# Patient Record
Sex: Female | Born: 1960 | Race: White | Hispanic: No | Marital: Married | Smoking: Never smoker
Health system: Southern US, Community
[De-identification: ages and names within clinical notes are randomized; demographics above are authoritative.]

## PROBLEM LIST (undated history)

## (undated) DIAGNOSIS — G43909 Migraine, unspecified, not intractable, without status migrainosus: Secondary | ICD-10-CM

## (undated) DIAGNOSIS — I10 Essential (primary) hypertension: Secondary | ICD-10-CM

## (undated) DIAGNOSIS — Z9889 Other specified postprocedural states: Secondary | ICD-10-CM

## (undated) DIAGNOSIS — M1990 Unspecified osteoarthritis, unspecified site: Secondary | ICD-10-CM

## (undated) DIAGNOSIS — K219 Gastro-esophageal reflux disease without esophagitis: Secondary | ICD-10-CM

## (undated) DIAGNOSIS — M797 Fibromyalgia: Secondary | ICD-10-CM

## (undated) DIAGNOSIS — E119 Type 2 diabetes mellitus without complications: Secondary | ICD-10-CM

## (undated) DIAGNOSIS — N189 Chronic kidney disease, unspecified: Secondary | ICD-10-CM

## (undated) DIAGNOSIS — G4730 Sleep apnea, unspecified: Secondary | ICD-10-CM

## (undated) DIAGNOSIS — F32A Depression, unspecified: Secondary | ICD-10-CM

## (undated) HISTORY — DX: Depression, unspecified: F32.A

## (undated) HISTORY — DX: Type 2 diabetes mellitus without complications: E11.9

## (undated) HISTORY — DX: Gastro-esophageal reflux disease without esophagitis: K21.9

## (undated) HISTORY — DX: Chronic kidney disease, unspecified: N18.9

## (undated) HISTORY — PX: SHOULDER SURGERY: SHX246

## (undated) HISTORY — PX: ABDOMINAL HYSTERECTOMY: SHX81

## (undated) HISTORY — PX: TONSILLECTOMY: SUR1361

## (undated) HISTORY — PX: PLACEMENT OF BREAST IMPLANTS: SHX6334

---

## 2014-02-04 DIAGNOSIS — C44301 Unspecified malignant neoplasm of skin of nose: Secondary | ICD-10-CM

## 2014-02-04 HISTORY — DX: Unspecified malignant neoplasm of skin of nose: C44.301

## 2015-12-10 ENCOUNTER — Encounter (HOSPITAL_COMMUNITY): Payer: Self-pay | Admitting: *Deleted

## 2015-12-10 ENCOUNTER — Emergency Department (HOSPITAL_COMMUNITY)
Admission: EM | Admit: 2015-12-10 | Discharge: 2015-12-10 | Disposition: A | Discharge status: Home / Self Care | Payer: BC Managed Care – PPO | Attending: Emergency Medicine | Admitting: Emergency Medicine

## 2015-12-10 DIAGNOSIS — G43009 Migraine without aura, not intractable, without status migrainosus: Secondary | ICD-10-CM | POA: Insufficient documentation

## 2015-12-10 DIAGNOSIS — Z7982 Long term (current) use of aspirin: Secondary | ICD-10-CM | POA: Insufficient documentation

## 2015-12-10 DIAGNOSIS — I10 Essential (primary) hypertension: Secondary | ICD-10-CM | POA: Insufficient documentation

## 2015-12-10 DIAGNOSIS — Z79899 Other long term (current) drug therapy: Secondary | ICD-10-CM | POA: Insufficient documentation

## 2015-12-10 DIAGNOSIS — R51 Headache: Secondary | ICD-10-CM | POA: Diagnosis present

## 2015-12-10 HISTORY — DX: Essential (primary) hypertension: I10

## 2015-12-10 HISTORY — DX: Migraine, unspecified, not intractable, without status migrainosus: G43.909

## 2015-12-10 MED ORDER — METOCLOPRAMIDE HCL 5 MG/ML IJ SOLN
10.0000 mg | Freq: Once | INTRAMUSCULAR | Status: AC
  Administered 2015-12-10: 10 mg via INTRAVENOUS
  Filled 2015-12-10: qty 2

## 2015-12-10 MED ORDER — SODIUM CHLORIDE 0.9 % IV BOLUS (SEPSIS)
1000.0000 mL | Freq: Once | INTRAVENOUS | Status: AC
  Administered 2015-12-10: 1000 mL via INTRAVENOUS

## 2015-12-10 MED ORDER — MAGNESIUM SULFATE 2 GM/50ML IV SOLN
2.0000 g | Freq: Once | INTRAVENOUS | Status: AC
  Administered 2015-12-10: 2 g via INTRAVENOUS
  Filled 2015-12-10: qty 50

## 2015-12-10 MED ORDER — DEXAMETHASONE SODIUM PHOSPHATE 10 MG/ML IJ SOLN
10.0000 mg | Freq: Once | INTRAMUSCULAR | Status: AC
  Administered 2015-12-10: 10 mg via INTRAVENOUS
  Filled 2015-12-10: qty 1

## 2015-12-10 MED ORDER — VALPROATE SODIUM 500 MG/5ML IV SOLN
1000.0000 mg | Freq: Once | INTRAVENOUS | Status: AC
  Administered 2015-12-10: 1000 mg via INTRAVENOUS
  Filled 2015-12-10: qty 10

## 2015-12-10 MED ORDER — VALPROATE SODIUM 500 MG/5ML IV SOLN
INTRAVENOUS | Status: AC
  Filled 2015-12-10: qty 10

## 2015-12-10 MED ORDER — DIPHENHYDRAMINE HCL 50 MG/ML IJ SOLN
25.0000 mg | Freq: Once | INTRAMUSCULAR | Status: AC
  Administered 2015-12-10: 25 mg via INTRAVENOUS
  Filled 2015-12-10: qty 1

## 2015-12-10 NOTE — ED Provider Notes (Signed)
[facility] Provider Note   CSN: [id number] Arrival date & time: 12/10/15  9562   Time seen 03:42 AM  History   Chief Complaint Chief Complaint  Patient presents with  . Headache    HPI Ashley Hickman is a 55 y.o. female.  HPI  she reports a long history of migraine headaches since she has been an adult. She reports however her headaches have been getting worse the last 2-1/2-3 months. She has been seen by her PCP who increased the dose of her Topamax which is what she takes prophylactically to prevent migraines. She reports it is not helping. She reports she went to an urgent care in the past 2 weeks and was given steroids which helped her headache. She states this headache happen yesterday morning when she awakened from sleep at about 7 AM. The pain is holocranio-and goes into her neck. She describes the headache as a pressure sensation. She has tried Excedrin Migraine, caffeine pills, ice on her neck, and sitting in a dark room without improvement of her headache. She has nausea without vomiting, she denies numbness of her extremities or tingling of her extremities, no visual changes, no aura. She denies fever. She states that bright lights and loud noises make the headache worse, however squeezing or putting pressure on her [place] makes the headache feel better. She reports she has an appointment on Tuesday, November 7 with a neurologist in [place] to evaluate her headaches. She states her doctor did a memory test on her and she does have some memory loss. She states she was evaluated by a neurologist about a year ago for memory loss. At that time she had a LP and had a post LP headache.  She reports family history of migraines in her maternal grandmother and a maternal uncle (who gets auras)  PCP [facility] at [facility]  Past Medical History:  Diagnosis Date  . Hypertension   . Migraine   depression, anxiety  There are no active problems to display for this  patient.   Past Surgical History:  Procedure Laterality Date  . CESAREAN SECTION    . PLACEMENT OF BREAST IMPLANTS    . SHOULDER SURGERY Right   . TONSILLECTOMY      OB History    No data available       Home Medications    Prior to Admission medications   Medication Sig Start Date End Date Taking? Authorizing Provider  aspirin-acetaminophen-caffeine (EXCEDRIN MIGRAINE) [phone] MG tablet Take by mouth every 6 (six) hours as needed for headache.   Yes Historical Provider, MD  eszopiclone (LUNESTA) 2 MG TABS tablet Take 2 mg by mouth at bedtime. Take immediately before bedtime   Yes Historical Provider, MD  omeprazole (PRILOSEC) 40 MG capsule Take 40 mg by mouth daily.   Yes Historical Provider, MD  topiramate (TOPAMAX) 100 MG tablet Take 100 mg by mouth 2 (two) times daily.   Yes Historical Provider, MD  Venlafaxine HCl 225 MG TB24 Take 225 mg by mouth.   Yes Historical Provider, MD    Family History History reviewed. No pertinent family history.  Social History Social History  Substance Use Topics  . Smoking status: Never Smoker  . Smokeless tobacco: Never Used  . Alcohol use Yes     Comment: occasionally  lives with spouse unemployed   Allergies   Penicillins and Sulfa antibiotics   Review of Systems Review of Systems  All other systems reviewed and are negative.  Physical Exam Updated Vital Signs BP 146/92 (BP Location: Left Arm)   Pulse 88   Temp 98.2 F (36.8 C) (Oral)   Resp 18   Ht 5' (1.524 m)   Wt 152 lb (68.9 kg)   SpO2 97%   BMI 29.69 kg/m   Vital signs normal    Physical Exam  Constitutional: She is oriented to person, place, and time. She appears well-developed and well-nourished.  Non-toxic appearance. She does not appear ill. No distress.  HENT:  Tax: Normocephalic and atraumatic.  Right Ear: External ear normal.  Left Ear: External ear normal.  Nose: Nose normal. No mucosal edema or rhinorrhea.  Mouth/Throat: Oropharynx  is clear and moist and mucous membranes are normal. No dental abscesses or uvula swelling.  Eyes: Conjunctivae and EOM are normal. Pupils are equal, round, and reactive to light.  Neck: Normal range of motion and full passive range of motion without pain. Neck supple.  Cardiovascular: Normal rate, regular rhythm and normal heart sounds.  Exam reveals no gallop and no friction rub.   No murmur heard. Pulmonary/Chest: Effort normal and breath sounds normal. No respiratory distress. She has no wheezes. She has no rhonchi. She has no rales. She exhibits no tenderness and no crepitus.  Abdominal: Soft. Normal appearance and bowel sounds are normal. She exhibits no distension. There is no tenderness. There is no rebound and no guarding.  Musculoskeletal: Normal range of motion. She exhibits no edema or tenderness.  Moves all extremities well.   Neurological: She is alert and oriented to person, place, and time. She has normal strength. No cranial nerve deficit.  Skin: Skin is warm, dry and intact. No rash noted. No erythema. No pallor.  Psychiatric: She has a normal mood and affect. Her speech is normal and behavior is normal. Her mood appears not anxious.  Nursing note and vitals reviewed.    ED Treatments / Results  Labs (all labs ordered are listed, but only abnormal results are displayed) Labs Reviewed - No data to display  EKG  EKG Interpretation None       Radiology No results found.  Procedures Procedures (including critical care time)  Medications Ordered in ED Medications  valproate (DEPACON) 1,000 mg in dextrose 5 % 50 mL IVPB (not administered)  sodium chloride 0.9 % bolus 1,000 mL (0 mLs Intravenous Stopped 12/10/15 0546)  metoCLOPramide (REGLAN) injection 10 mg (10 mg Intravenous Given 12/10/15 0414)  diphenhydrAMINE (BENADRYL) injection 25 mg (25 mg Intravenous Given 12/10/15 0414)  dexamethasone (DECADRON) injection 10 mg (10 mg Intravenous Given 12/10/15 0414)   magnesium sulfate IVPB 2 g 50 mL (0 g Intravenous Stopped 12/10/15 0714)     Initial Impression / Assessment and Plan / ED Course  I have reviewed the triage vital signs and the nursing notes.  Pertinent labs & imaging results that were available during my care of the patient were reviewed by me and considered in my medical decision making (see chart for details).  Clinical Course    Pt was given migraine cocktail consisting of IV fluids, IV Reglan and Benadryl and IV Decadron which she states helped before.  Recheck 06:00 AM pt states headache better, but still present. IV magnesium was added.   Recheck 06:50 AM still has headache, given depacon IV.   Final Clinical Impressions(s) / ED Diagnoses   Final diagnoses:  Migraine without aura and without status migrainosus, not intractable    Plan discharge  Devoria AlbeIva Darcella Shiffman, MD, Armando GangFACEP  Devoria AlbeIva Latrell Potempa, MD 12/10/15 [phone]

## 2015-12-10 NOTE — ED Triage Notes (Signed)
Pt c/o headache that started yesterday pt states she has an appointment with a neurologist in 3 days

## 2015-12-10 NOTE — Discharge Instructions (Signed)
Drink plenty of fluids. Keep your appointment with the neurologist. Look at the information on the recurrent migraine headache information sheet.  Recheck as needed.

## 2016-02-14 ENCOUNTER — Emergency Department (HOSPITAL_COMMUNITY)
Admission: EM | Admit: 2016-02-14 | Discharge: 2016-02-14 | Disposition: A | Discharge status: Home / Self Care | Payer: BC Managed Care – PPO | Attending: Emergency Medicine | Admitting: Emergency Medicine

## 2016-02-14 ENCOUNTER — Encounter (HOSPITAL_COMMUNITY): Payer: Self-pay | Admitting: *Deleted

## 2016-02-14 ENCOUNTER — Emergency Department (HOSPITAL_COMMUNITY): Payer: BC Managed Care – PPO

## 2016-02-14 DIAGNOSIS — Z79899 Other long term (current) drug therapy: Secondary | ICD-10-CM | POA: Insufficient documentation

## 2016-02-14 DIAGNOSIS — I10 Essential (primary) hypertension: Secondary | ICD-10-CM | POA: Insufficient documentation

## 2016-02-14 DIAGNOSIS — R51 Headache: Secondary | ICD-10-CM | POA: Diagnosis present

## 2016-02-14 DIAGNOSIS — G43909 Migraine, unspecified, not intractable, without status migrainosus: Secondary | ICD-10-CM | POA: Insufficient documentation

## 2016-02-14 MED ORDER — KETOROLAC TROMETHAMINE 30 MG/ML IJ SOLN
30.0000 mg | Freq: Once | INTRAMUSCULAR | Status: AC
  Administered 2016-02-14: 30 mg via INTRAVENOUS
  Filled 2016-02-14: qty 1

## 2016-02-14 MED ORDER — MORPHINE SULFATE (PF) 4 MG/ML IV SOLN
4.0000 mg | Freq: Once | INTRAVENOUS | Status: AC
  Administered 2016-02-14: 4 mg via INTRAVENOUS
  Filled 2016-02-14: qty 1

## 2016-02-14 MED ORDER — PROCHLORPERAZINE EDISYLATE 5 MG/ML IJ SOLN: 10.0000 mg | INTRAMUSCULAR | Status: DC

## 2016-02-14 MED ORDER — PROCHLORPERAZINE EDISYLATE 5 MG/ML IJ SOLN
10.0000 mg | Freq: Four times a day (QID) | INTRAMUSCULAR | Status: DC | PRN
  Administered 2016-02-14: 10 mg via INTRAVENOUS
  Filled 2016-02-14: qty 2

## 2016-02-14 MED ORDER — SODIUM CHLORIDE 0.9 % IV BOLUS (SEPSIS)
1000.0000 mL | Freq: Once | INTRAVENOUS | Status: AC
Start: 2016-02-14 — End: 2016-02-14
  Administered 2016-02-14: 1000 mL via INTRAVENOUS

## 2016-02-14 NOTE — ED Provider Notes (Addendum)
[facility] Provider Note   CSN: [id number] Arrival date & time: 02/14/16  [id number]     History   Chief Complaint Chief Complaint  Patient presents with  . Headache    HPI Ashley Hickman is a 56 y.o. female.  HPI Patient presents to the emergency department with complaints of ongoing right-sided headache since yesterday.  She denies nausea vomiting.  She reports no significant photophobia.  She does have a history of migraine headaches but reports this feels slightly different.  She denies fevers or chills.  No neck stiffness.  No vomiting.  Pain is moderate in severity.  Her home migraine medications have not been helping.  No visual changes.  No recent injury or trauma.  No other complaints   Past Medical History:  Diagnosis Date  . Hypertension   . Migraine     There are no active problems to display for this patient.   Past Surgical History:  Procedure Laterality Date  . CESAREAN SECTION    . PLACEMENT OF BREAST IMPLANTS    . SHOULDER SURGERY Right   . TONSILLECTOMY      OB History    No data available       Home Medications    Prior to Admission medications   Medication Sig Start Date End Date Taking? Authorizing Provider  ARIPiprazole (ABILIFY) 5 MG tablet Take 5 mg by mouth daily.   Yes Historical Provider, MD  divalproex (DEPAKOTE) 500 MG DR tablet Take 500 mg by mouth 2 (two) times daily.   Yes Historical Provider, MD  lisinopril (PRINIVIL,ZESTRIL) 40 MG tablet Take 40 mg by mouth daily.   Yes Historical Provider, MD  omeprazole (PRILOSEC) 40 MG capsule Take 40 mg by mouth daily.   Yes Historical Provider, MD  rizatriptan (MAXALT) 10 MG tablet Take 10 mg by mouth as needed for migraine. May repeat in 2 hours if needed   Yes Historical Provider, MD  aspirin-acetaminophen-caffeine (EXCEDRIN MIGRAINE) [phone] MG tablet Take by mouth every 6 (six) hours as needed for headache.    Historical Provider, MD  Venlafaxine HCl 225 MG TB24 Take 225 mg by mouth.     Historical Provider, MD    Family History History reviewed. No pertinent family history.  Social History Social History  Substance Use Topics  . Smoking status: Never Smoker  . Smokeless tobacco: Never Used  . Alcohol use Yes     Comment: occasionally     Allergies   Penicillins and Sulfa antibiotics   Review of Systems Review of Systems  All other systems reviewed and are negative.    Physical Exam Updated Vital Signs BP 157/84   Pulse 76   Temp 98.3 F (36.8 C) (Oral)   Resp 18   Ht 5' (1.524 m)   Wt 155 lb (70.3 kg)   SpO2 95%   BMI 30.27 kg/m   Physical Exam  Constitutional: She is oriented to person, place, and time. She appears well-developed and well-nourished. No distress.  HENT:  [place]: Normocephalic and atraumatic.  Eyes: EOM are normal. Pupils are equal, round, and reactive to light.  Neck: Normal range of motion.  Cardiovascular: Normal rate, regular rhythm and normal heart sounds.   Pulmonary/Chest: Effort normal and breath sounds normal.  Abdominal: Soft. She exhibits no distension. There is no tenderness.  Musculoskeletal: Normal range of motion.  Neurological: She is alert and oriented to person, place, and time.  5/5 strength in major muscle groups of  bilateral upper and lower  extremities. Speech normal. No facial asymetry.   Skin: Skin is warm and dry.  Psychiatric: She has a normal mood and affect. Judgment normal.  Nursing note and vitals reviewed.    ED Treatments / Results  Labs (all labs ordered are listed, but only abnormal results are displayed) Labs Reviewed - No data to display  EKG  EKG Interpretation None       Radiology [place] CT - personally reviewed  Ct [place] Wo Contrast  Result Date: 02/14/2016 CLINICAL DATA:  Atypical headache, primarily on right side EXAM: CT [place] WITHOUT CONTRAST TECHNIQUE: Contiguous axial images were obtained from the base of the skull through the vertex without intravenous contrast.  COMPARISON:  None. FINDINGS: Brain: There is slight diffuse atrophy for age. There is no intracranial mass, hemorrhage, extra-axial fluid collection, or midline shift. There is slight small vessel disease in the centra semiovale bilaterally. No acute appearing infarct is demonstrable. Vascular: There is no hyperdense vessel. There is no evident vascular calcification. Skull: The bony calvarium appears intact. Sinuses/Orbits: There is mucosal thickening in several ethmoid air cells on the left. Visualized paranasal sinuses elsewhere are clear. Visualized orbits appear symmetric bilaterally. Other: Mastoid air cells are clear. IMPRESSION: Mild atrophy for age with mild patchy periventricular small vessel disease. No intracranial mass, hemorrhage, or extra-axial fluid collection. No acute appearing infarct. Mild mucosal thickening noted in several ethmoid air cells. Electronically Signed   By: Bretta Bang III M.D.   On: 02/14/2016 07:36     Procedures Procedures (including critical care time)  Medications Ordered in ED Medications  sodium chloride 0.9 % bolus 1,000 mL (1,000 mLs Intravenous New Bag/Given 02/14/16 1610)  ketorolac (TORADOL) 30 MG/ML injection 30 mg (30 mg Intravenous Given 02/14/16 0621)  morphine 4 MG/ML injection 4 mg (4 mg Intravenous Given 02/14/16 9604)     Initial Impression / Assessment and Plan / ED Course  I have reviewed the triage vital signs and the nursing notes.  Pertinent labs & imaging results that were available during my care of the patient were reviewed by me and considered in my medical decision making (see chart for details).  Clinical Course     7:37 AM Patient feels much better at this time.  Discharge home in good condition.  Nonfocal neurologic exam.  [place] CT per my evaluation shows no acute intracranial abnormality.  She feels better.  Home with Zofran.  Primary care and outpatient neurology follow-up.  Final Clinical Impressions(s) / ED Diagnoses     Final diagnoses:  Migraine without status migrainosus, not intractable, unspecified migraine type    New Prescriptions New Prescriptions   No medications on file     Azalia Bilis, MD 02/14/16 [id number]    Azalia Bilis, MD 02/14/16 [phone]

## 2016-02-14 NOTE — ED Triage Notes (Signed)
Pt c/o right side Hagstrom pain that radiates down her neck that started yesterday; pt denies any n/v

## 2016-02-14 NOTE — ED Notes (Signed)
ED Provider at bedside. 

## 2018-03-11 IMAGING — CT CT HEAD W/O CM
3 series · 15 of 45 positions shown, 18 images · non-contrast
Comparison: None.

CLINICAL DATA: Atypical headache, primarily on right side

EXAM:
CT HEAD WITHOUT CONTRAST
TECHNIQUE: Contiguous axial images were obtained from the base of the skull
through the vertex without intravenous contrast.

[Series 2: head trauma wo · axial · 0.41mm/px · z∈[+35,+150]mm · 9 of 28 slices shown, 12 images]
[im 3/28  brain]
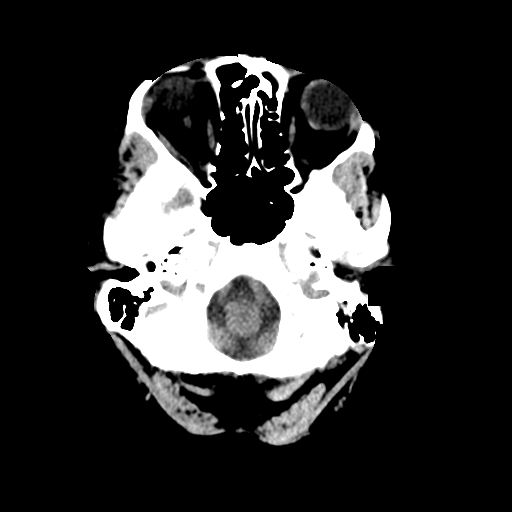
[im 3/28  bone]
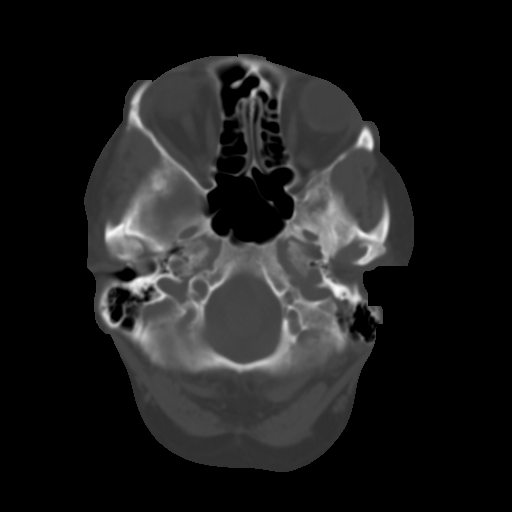
[im 6/28  brain]
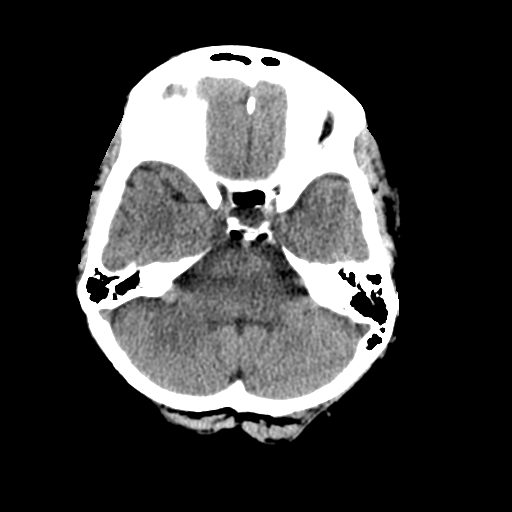
[im 9/28  brain]
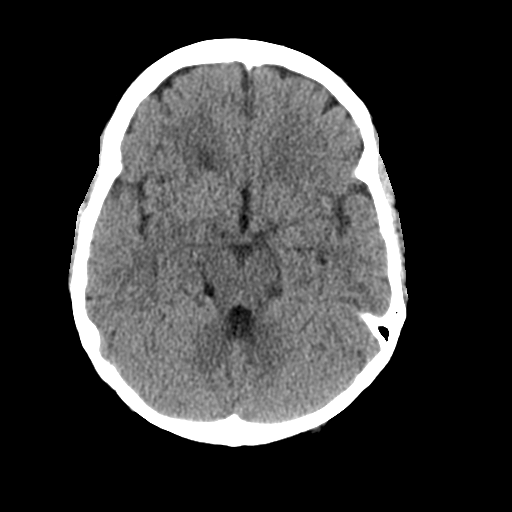
[im 12/28  brain]
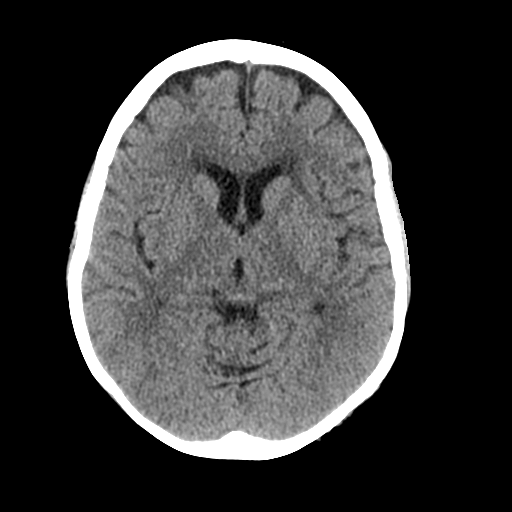
[im 15/28  brain]
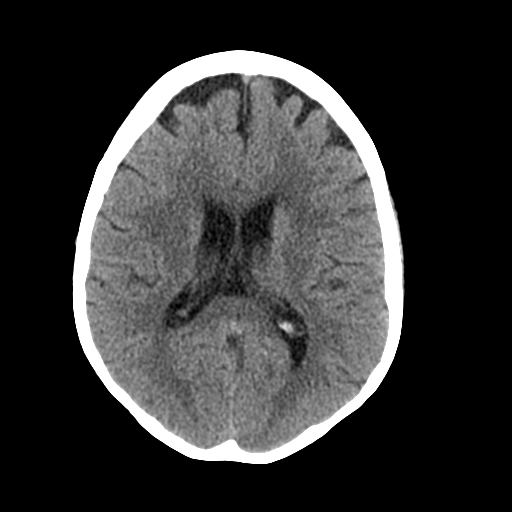
[im 15/28  bone]
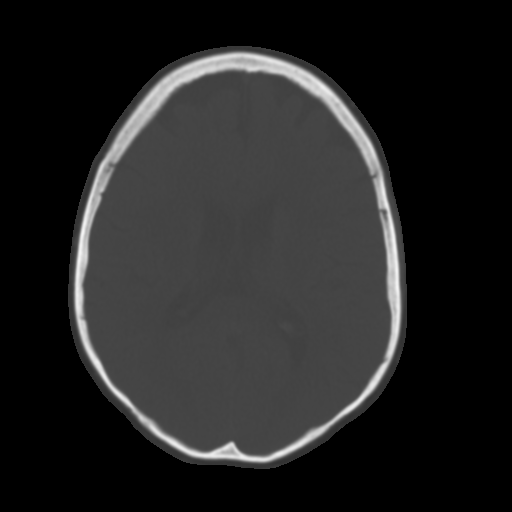
[im 17/28  brain]
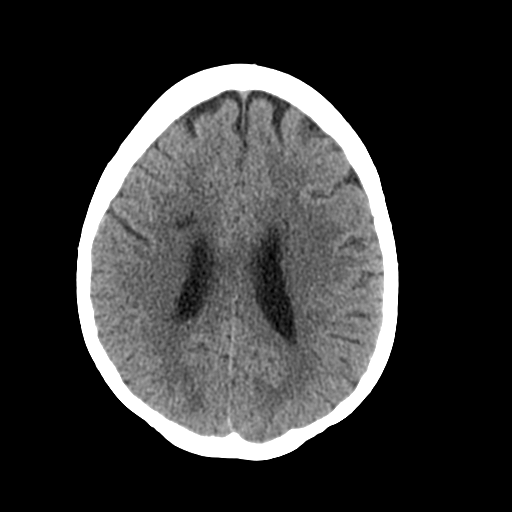
[im 20/28  brain]
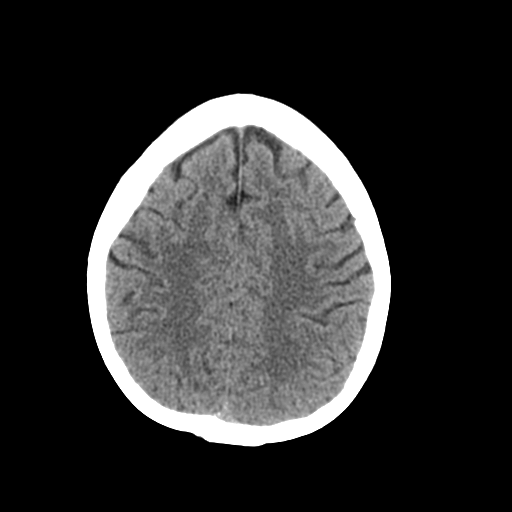
[im 23/28  brain]
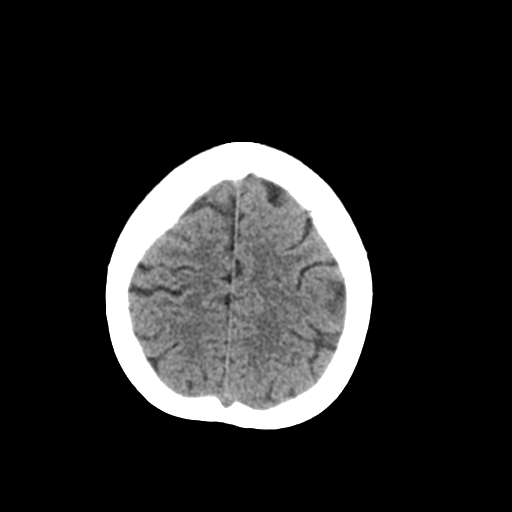
[im 26/28  brain]
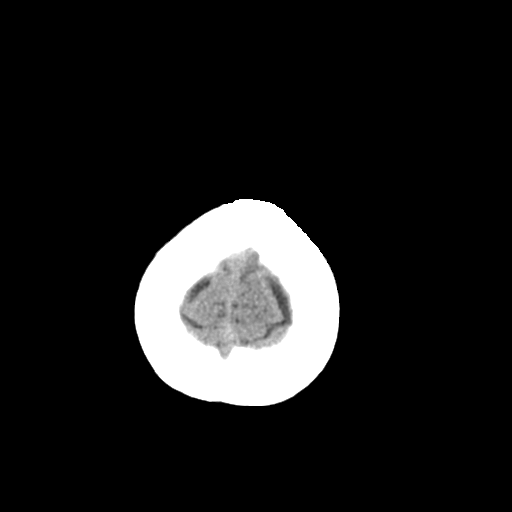
[im 26/28  bone]
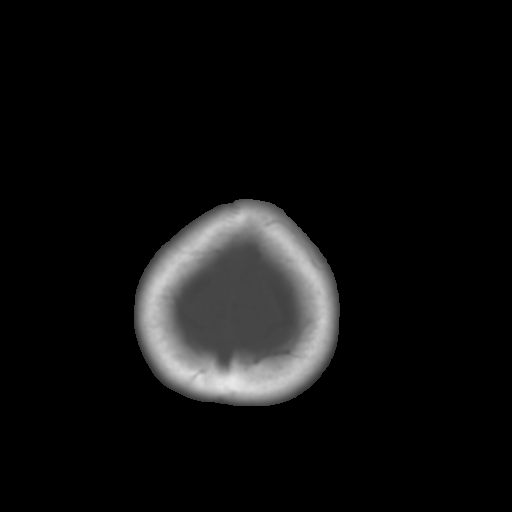

[Series 4: coronal soft tissue · coronal · 0.29mm/px · 3 of 61 slices shown]
[im 21/61  brain]
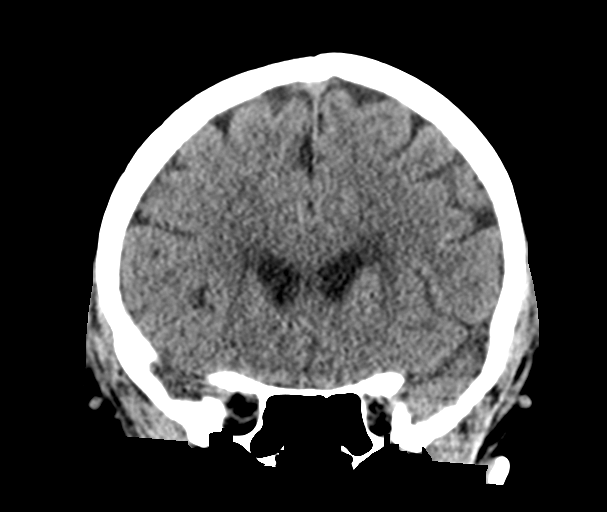
[im 27/61  brain]
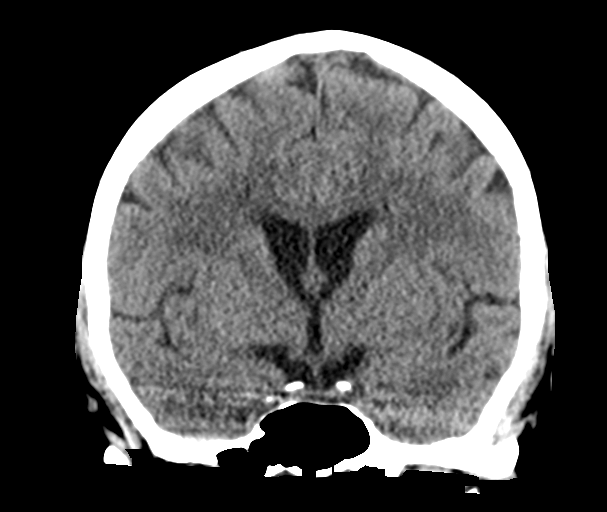
[im 34/61  brain]
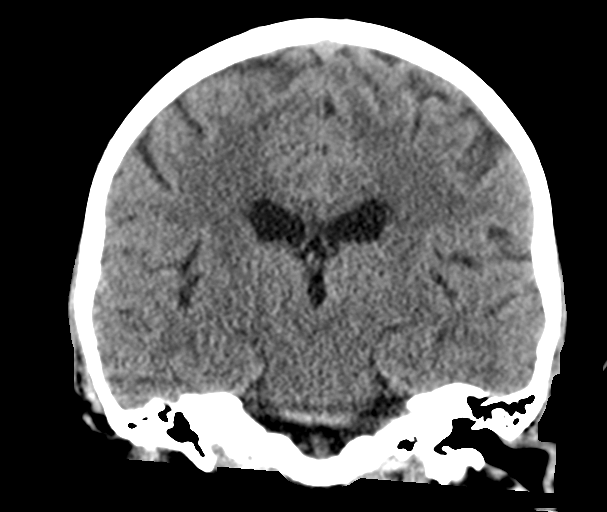

[Series 5: sagittal soft tissue · sagittal · 0.32mm/px · 3 of 50 slices shown]
[im 17/50  brain]
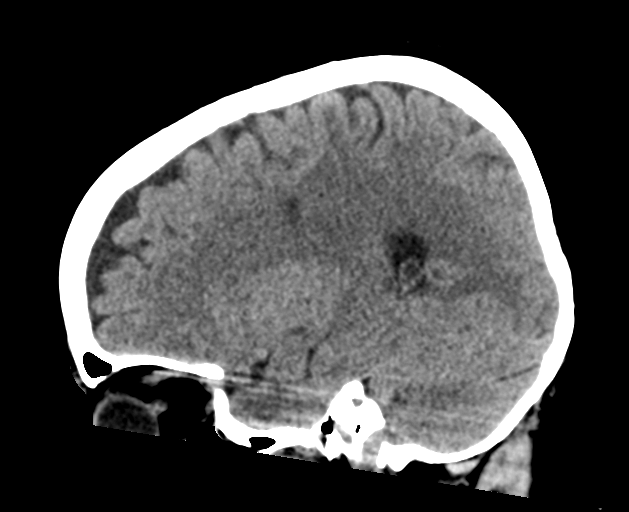
[im 25/50  brain]
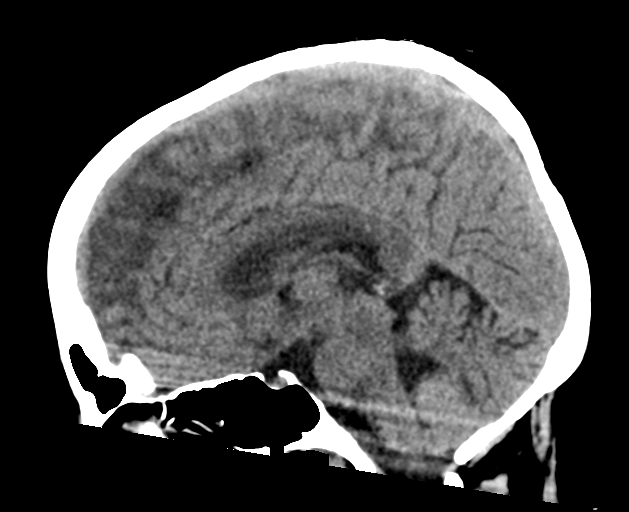
[im 33/50  brain]
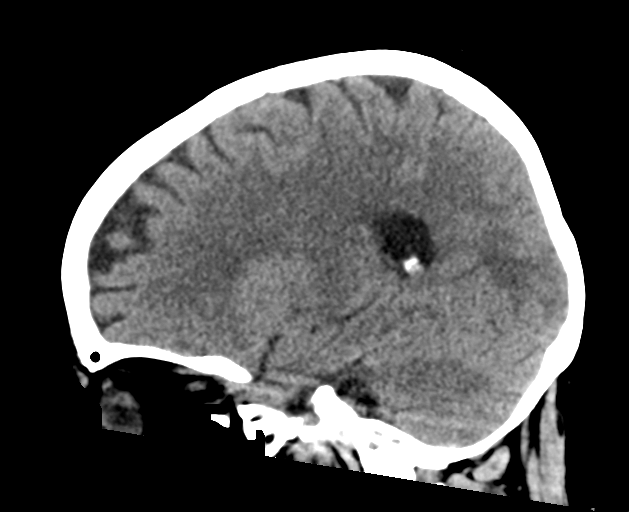

[15 of 45 positions shown; findings below may reference images not displayed]

FINDINGS: Brain: There is slight diffuse atrophy for age. There is no
intracranial mass, hemorrhage, extra-axial fluid collection, or
midline shift. There is slight small vessel disease in the centra
semiovale bilaterally. No acute appearing infarct is demonstrable.

Vascular: There is no hyperdense vessel. There is no evident
vascular calcification.

Skull: The bony calvarium appears intact.

Sinuses/Orbits: There is mucosal thickening in several ethmoid air
cells on the left. Visualized paranasal sinuses elsewhere are clear.
Visualized orbits appear symmetric bilaterally.

Other: Mastoid air cells are clear.
IMPRESSION: Mild atrophy for age with mild patchy periventricular small vessel
disease. No intracranial mass, hemorrhage, or extra-axial fluid
collection. No acute appearing infarct. Mild mucosal thickening
noted in several ethmoid air cells.

## 2019-02-05 DIAGNOSIS — I639 Cerebral infarction, unspecified: Secondary | ICD-10-CM

## 2019-02-05 HISTORY — DX: Cerebral infarction, unspecified: I63.9

## 2021-03-28 ENCOUNTER — Encounter: Payer: Self-pay | Admitting: *Deleted

## 2021-04-09 ENCOUNTER — Encounter: Payer: Self-pay | Admitting: Gastroenterology

## 2021-04-09 NOTE — Progress Notes (Signed)
Referring Provider: Janean Sark, NP Primary Care Physician:  Janean Sark, NP Primary Gastroenterologist:  Dr. Jena Gauss  Chief Complaint  Patient presents with   Colonoscopy    EGD, hemorrhoids bleeded    HPI:   Ashley Hickman is a 61 y.o. female presenting today at the request of Janean Sark, NP for consult colonoscopy/EGD.  No information included in referral to review.  Per patient, last colonoscopy 7- 10 years ago in [place]. Had some polyps. Sister with precancerous polyp and had to have 75% of her colon removed. Age 66.   Has brbpr intermittently for a couple of years. Associated rectal pain with a BM. Described as a dull pain. Blood is in toilet water and on toilet tissue. This past week, she had bleeding 4/7 days which is a little unusual. Doesn't use preparation H. Has chronic constipation. Bowels move every 3-4 days, incomplete, has to strain, hard. Will use use Miralax as needed which does work fairly well.  No unintentional weight loss.  She lost 50 pounds in the last year intentionally through dietary changes.  Feels solid foods get hung in her throat/upper esophagus. Liquids will push items down. Started 6-7 months ago. No trouble with soft foods, liquids, or pills. Chronic GERD well controlled on omeprazole 40 mg daily. Reports EGD in the past in [place] with normal exam. No prior dilation. No abdomial pain, nausea, or vomiting.   Takes Norco as need for chronic back pain. Not daily. At the most, a few days a week. May not take it for a whole week.   Past Medical History:  Diagnosis Date   CKD (chronic kidney disease)    Depression    GERD (gastroesophageal reflux disease)    Hypertension    Migraine    T2DM (type 2 diabetes mellitus) (HCC)     Past Surgical History:  Procedure Laterality Date   ABDOMINAL HYSTERECTOMY     CESAREAN SECTION     PLACEMENT OF BREAST IMPLANTS     SHOULDER SURGERY Right    TONSILLECTOMY      Current  Outpatient Medications  Medication Sig Dispense Refill   ARIPiprazole (ABILIFY) 5 MG tablet Take 5 mg by mouth daily.     Atogepant (QULIPTA) 60 MG TABS      B Complex-C (SUPER B-COMPLEX + VITAMIN C) TABS      Calcium Citrate-Vitamin D 315-5 MG-MCG TABS 1 tablet 1 (one) time each day     Cholecalciferol (VITAMIN D3) 25 MCG (1000 UT) CAPS      EASYMAX TEST test strip USE AS DIRECTED testing ONCE A DAY varying TIMES (fasting AND 2 HOURS AFTER largest MEAL)     Embrace Lancets Ultra Thin 30G MISC USE AS DIRECTED ONCE DAILY testing AT varying TIMES (fasting AND 2 HOURS AFTER largest MEAL)     eszopiclone (LUNESTA) 2 MG TABS tablet      Flaxseed, Linseed, (FLAXSEED OIL) 1200 MG CAPS      folic acid (FOLVITE) 800 MCG tablet      glucose blood (EASYMAX TEST) test strip USE AS DIRECTED testing ONCE A DAY varying TIMES (fasting AND 2 HOURS AFTER largest MEAL)     HYDROcodone-acetaminophen (NORCO) 10-325 MG tablet Take 1 tablet by mouth every 6 (six) hours as needed.     hydrocortisone (ANUSOL-HC) 2.5 % rectal cream Place 1 application. rectally 2 (two) times daily. 30 g 1   JANUVIA 50 MG tablet Take 50 mg by mouth daily.  lisinopril (ZESTRIL) 20 MG tablet Take 20 mg by mouth 2 (two) times daily.     Magnesium Citrate 100 MG CAPS Take by mouth.     memantine (NAMENDA) 10 MG tablet Take by mouth.     methocarbamol (ROBAXIN) 500 MG tablet Take 500 mg by mouth 3 (three) times daily as needed.     nitrofurantoin (MACRODANTIN) 100 MG capsule Take by mouth.     omeprazole (PRILOSEC) 40 MG capsule Take 40 mg by mouth daily.     propranolol (INDERAL) 10 MG tablet Take 20 mg by mouth 2 (two) times daily.     rizatriptan (MAXALT) 10 MG tablet Take 10 mg by mouth as needed for migraine. May repeat in 2 hours if needed     rosuvastatin (CRESTOR) 20 MG tablet Take 20 mg by mouth at bedtime.     tolterodine (DETROL LA) 4 MG 24 hr capsule Take 4 mg by mouth daily.     Turmeric 500 MG CAPS Take by mouth.      Venlafaxine HCl 225 MG TB24 Take 225 mg by mouth.     No current facility-administered medications for this visit.    Allergies as of 04/11/2021 - Review Complete 04/11/2021  Allergen Reaction Noted   Penicillins  12/10/2015   Sulfa antibiotics  12/10/2015    Family History  Problem Relation Age of Onset   Colon polyps Sister        pre-cancerous, numerous, required partial colectomy    Social History   Socioeconomic History   Marital status: Married    Spouse name: Not on file   Number of children: Not on file   Years of education: Not on file   Highest education level: Not on file  Occupational History   Not on file  Tobacco Use   Smoking status: Never   Smokeless tobacco: Never  Substance and Sexual Activity   Alcohol use: Yes    Comment: occasionally   Drug use: No   Sexual activity: Not on file  Other Topics Concern   Not on file  Social History Narrative   Not on file   Social Determinants of Health   Financial Resource Strain: Not on file  Food Insecurity: Not on file  Transportation Needs: Not on file  Physical Activity: Not on file  Stress: Not on file  Social Connections: Not on file  Intimate Partner Violence: Not on file    Review of Systems: Gen: Denies any fever, chills, cold or flulike symptoms, presyncope, syncope. CV: Denies chest pain, heart palpitations.  Resp: Denies shortness of breath or cough. GI: See HPI GU : Denies urinary burning, urinary frequency, urinary hesitancy MS: Denies joint pain. Derm: Denies rash. Psych: Admits to depression, under good control with medications. Heme: See HPI  Physical Exam: BP 120/72    Pulse 80    Temp 97.7 F (36.5 C) (Temporal)    Ht 5' (1.524 m)    Wt 142 lb (64.4 kg)    BMI 27.73 kg/m  General:   Alert and oriented. Pleasant and cooperative. Well-nourished and well-developed.  Clay:  Normocephalic and atraumatic. Eyes:  Without icterus, sclera clear and conjunctiva pink.  Ears:  Normal  auditory acuity. Lungs:  Clear to auscultation bilaterally. No wheezes, rales, or rhonchi. No distress.  Heart:  S1, S2 present without murmurs appreciated.  Abdomen:  +BS, soft, non-tender and non-distended. No HSM noted. No guarding or rebound. No masses appreciated.  Rectal:  External hemorrhoid appreciated without tenderness  to palpation, no appreciable internal hemorrhoids, soft, formed stool felt in rectal vault. No bright red blood or melena.  Msk:  Symmetrical without gross deformities. Normal posture. Extremities:  Without edema. Neurologic:  Alert and  oriented x4;  grossly normal neurologically. Skin:  Intact without significant lesions or rashes. Psych:  Normal mood and affect.    Assessment:  61 year old female with history of CKD, GERD, type 2 diabetes, HTN, presenting today to discuss scheduling surveillance colonoscopy and also reporting rectal bleeding, constipation, and dysphagia.  Rectal bleeding:  Couple year history of intermittent bright red blood per rectum that has been fairly infrequent with blood in toilet water and on toilet tissue.  This past week, she has had bleeding 4 out of 7 days a week which is a little unusual.  She has some associated rectal discomfort with bowel movements in the setting of constipation.  Rectal exam with external hemorrhoid appreciated, no internal hemorrhoids appreciated, formed stool in rectal vault.  No BRBPR or melena.  Last colonoscopy 7-10 years ago in [place].  Patient with polyps.  Suspect rectal bleeding is likely secondary to hemorrhoids though cannot rule out colon polyps or malignancy.  We will update a CBC, treat hemorrhoids with Anusol rectal cream, manage her constipation as per below, and plan to schedule a colonoscopy in the near future for further evaluation.  Constipation: Chronic history of constipation with bowel movements every 3 to 4 days that are incomplete, hard, and require straining.  Uses MiraLAX as needed which  does work well.  Recommended starting MiraLAX daily.  History of colon polyps: Patient reports history of colon polyps on her colonoscopy 7-10 years ago in [place].  She also reports her sister had numerous precancerous polyps and had to have 75% of her colon removed at age 61.  She is currently having some rectal bleeding as discussed above, possibly secondary to hemorrhoids in the setting of constipation though I am unable to rule out colon polyps or malignancy.  We will arrange for her to have a colonoscopy in the near future.  Constipation hemorrhoids addressed above.  We did discuss the importance of her bowels moving well prior to her colonoscopy.  She is to let me know if MiraLAX daily does not work well we will get her started on a prescriptive agent.  Dysphagia: 6-79-month history of intermittent solid food dysphagia to meats and breads.  Times passed with liquids.  No regurgitation.  She does have chronic history of GERD, but this is well controlled on omeprazole 40 mg daily.  Reports she had an EGD years ago in [place], but has never had esophageal dilation.  We will plan for EGD +/- dilation for further evaluation and therapeutic intervention.  Suspect she may have esophageal web, ring, or stricture.  Less likely malignancy.   Plan: CBC We will proceed with colonoscopy and upper endoscopy with possible esophageal dilation with propofol with Dr. Jena Gauss in the near future. The risks, benefits, and alternatives have been discussed with the patient in detail. The patient states understanding and desires to proceed.  ASA 2 See separate instructions for diabetes medication adjustments Anusol rectal cream twice daily x7 days, then as needed. MiraLAX 1 capful (17 g) daily in 8 ounces of water.  Requested to wait progress report.  Discussed the importance of bowels moving well prior to colonoscopy to ensure a good prep. Continue omeprazole 40 mg daily. Avoid tough textures, chopped meats finely,  eat slowly, take small bites, chew thoroughly, drink plenty  of liquids throughout meals. Advised to proceed to the emergency room if something gets hung in her esophagus and will not come up or go down. Follow-up after procedures.   Ermalinda MemosKristen Corrigan Kretschmer, PA-C [facility] 04/11/2021

## 2021-04-11 ENCOUNTER — Other Ambulatory Visit: Payer: Self-pay

## 2021-04-11 ENCOUNTER — Encounter: Payer: Self-pay | Admitting: Gastroenterology

## 2021-04-11 ENCOUNTER — Ambulatory Visit (INDEPENDENT_AMBULATORY_CARE_PROVIDER_SITE_OTHER): Payer: BC Managed Care – PPO | Admitting: Gastroenterology

## 2021-04-11 VITALS — BP 120/72 | HR 80 | Temp 97.7°F | Ht 60.0 in | Wt 142.0 lb

## 2021-04-11 DIAGNOSIS — K625 Hemorrhage of anus and rectum: Secondary | ICD-10-CM | POA: Diagnosis not present

## 2021-04-11 DIAGNOSIS — Z86010 Personal history of colonic polyps: Secondary | ICD-10-CM

## 2021-04-11 DIAGNOSIS — R1310 Dysphagia, unspecified: Secondary | ICD-10-CM | POA: Diagnosis not present

## 2021-04-11 DIAGNOSIS — K649 Unspecified hemorrhoids: Secondary | ICD-10-CM | POA: Diagnosis not present

## 2021-04-11 DIAGNOSIS — K5900 Constipation, unspecified: Secondary | ICD-10-CM

## 2021-04-11 MED ORDER — HYDROCORTISONE (PERIANAL) 2.5 % EX CREA: 1.0000 "application " | TOPICAL_CREAM | Freq: Two times a day (BID) | CUTANEOUS | 1 refills | Status: DC

## 2021-04-11 NOTE — Patient Instructions (Addendum)
Please have blood work completed at [facility]. ? ?We will arrange for you to have an upper endoscopy with possible stretching of your esophagus and colonoscopy in the near future with Dr. Jena Gauss. ?1 day prior to procedure: You may take Januvia as prescribed. ?Day of your procedure: Do not take any morning diabetes medications. ? ?For hemorrhoids/rectal bleeding: ?Use Anusol rectal cream twice daily for 7 days, then stop.  Use again as needed. ? ?For constipation: ?Start MiraLAX 1 capful (17 g) daily in 8 ounces of water.  ?Please call with a progress report in 2 weeks to let me know how this is working for you. ?As we discussed, it is very important that we get your bowels moving regularly prior to your colonoscopy to ensure that you have a good prep. ? ?For your reflux symptoms: ?Continue taking omeprazole 40 mg daily 30 minutes before breakfast. ? ?For your swallowing trouble: ?We will be evaluating this at the time of your upper endoscopy. ?Eat slowly, take small bites, chew thoroughly, drink plenty of liquids throughout your meals. ?Avoid tough textures. ?Meats should be chopped finely. ?If something gets stuck in your esophagus and will not come up or go down, proceed to the emergency room. ? ?We will see you back after your procedures.  Do not hesitate to call if you have any questions or concerns prior to your next visit. ? ?It was a pleasure meeting you today! ? ?Ermalinda Memos, PA-C ?[facility] ? ?

## 2021-04-12 ENCOUNTER — Telehealth: Payer: Self-pay | Admitting: *Deleted

## 2021-04-12 MED ORDER — CLENPIQ 10-3.5-12 MG-GM -GM/160ML PO SOLN: 1.0000 | Freq: Once | ORAL | 0 refills | Status: AC

## 2021-04-12 NOTE — Telephone Encounter (Signed)
Called pt. She has been scheduled for TCS/EGD +/-DIL with propofol Dr. Gala Romney, ASA 2 on 4/24 at 7:30am. Aware will send rx to pharmacy and mail instructions. ?

## 2021-04-20 ENCOUNTER — Telehealth: Payer: Self-pay | Admitting: Internal Medicine

## 2021-04-20 ENCOUNTER — Other Ambulatory Visit: Payer: Self-pay | Admitting: Gastroenterology

## 2021-04-20 NOTE — Telephone Encounter (Signed)
Pt LMOM that she received her paperwork for her colonoscopy with Dr Jena Gauss on 05/28/2021 and wanted to let us know that she was diabetic. Please advise. [phone] ?

## 2021-04-21 LAB — CBC WITH DIFFERENTIAL/PLATELET
Basophils Absolute: 0.1 10*3/uL (ref 0.0–0.2)
Basos: 1 %
EOS (ABSOLUTE): 0.2 10*3/uL (ref 0.0–0.4)
Eos: 2 %
Hematocrit: 43.8 % (ref 34.0–46.6)
Hemoglobin: 14.1 g/dL (ref 11.1–15.9)
Immature Grans (Abs): 0 10*3/uL (ref 0.0–0.1)
Immature Granulocytes: 0 %
Lymphocytes Absolute: 2.1 10*3/uL (ref 0.7–3.1)
Lymphs: 22 %
MCH: 27.9 pg (ref 26.6–33.0)
MCHC: 32.2 g/dL (ref 31.5–35.7)
MCV: 87 fL (ref 79–97)
Monocytes Absolute: 0.7 10*3/uL (ref 0.1–0.9)
Monocytes: 8 %
Neutrophils Absolute: 6.4 10*3/uL (ref 1.4–7.0)
Neutrophils: 67 %
Platelets: 276 10*3/uL (ref 150–450)
RBC: 5.05 x10E6/uL (ref 3.77–5.28)
RDW: 13.1 % (ref 11.7–15.4)
WBC: 9.5 10*3/uL (ref 3.4–10.8)

## 2021-04-23 NOTE — Telephone Encounter (Signed)
Noted. Instructions already have adjustments ?

## 2021-05-28 ENCOUNTER — Other Ambulatory Visit: Payer: Self-pay

## 2021-05-28 ENCOUNTER — Ambulatory Visit (HOSPITAL_COMMUNITY)
Admission: RE | Admit: 2021-05-28 | Discharge: 2021-05-28 | Disposition: A | Discharge status: Home / Self Care | Payer: BC Managed Care – PPO | Attending: Internal Medicine | Admitting: Internal Medicine

## 2021-05-28 ENCOUNTER — Encounter (HOSPITAL_COMMUNITY): Payer: Self-pay | Admitting: Internal Medicine

## 2021-05-28 ENCOUNTER — Encounter (HOSPITAL_COMMUNITY)
Admission: RE | Disposition: A | Discharge status: Home / Self Care | Payer: Self-pay | Source: Home / Self Care | Attending: Internal Medicine

## 2021-05-28 ENCOUNTER — Ambulatory Visit (HOSPITAL_COMMUNITY): Payer: BC Managed Care – PPO | Admitting: Anesthesiology

## 2021-05-28 ENCOUNTER — Telehealth: Payer: Self-pay | Admitting: *Deleted

## 2021-05-28 DIAGNOSIS — I129 Hypertensive chronic kidney disease with stage 1 through stage 4 chronic kidney disease, or unspecified chronic kidney disease: Secondary | ICD-10-CM | POA: Insufficient documentation

## 2021-05-28 DIAGNOSIS — Z7984 Long term (current) use of oral hypoglycemic drugs: Secondary | ICD-10-CM | POA: Diagnosis not present

## 2021-05-28 DIAGNOSIS — F32A Depression, unspecified: Secondary | ICD-10-CM | POA: Diagnosis not present

## 2021-05-28 DIAGNOSIS — N189 Chronic kidney disease, unspecified: Secondary | ICD-10-CM | POA: Insufficient documentation

## 2021-05-28 DIAGNOSIS — E1122 Type 2 diabetes mellitus with diabetic chronic kidney disease: Secondary | ICD-10-CM | POA: Insufficient documentation

## 2021-05-28 DIAGNOSIS — R1310 Dysphagia, unspecified: Secondary | ICD-10-CM | POA: Insufficient documentation

## 2021-05-28 DIAGNOSIS — Z8371 Family history of colonic polyps: Secondary | ICD-10-CM | POA: Insufficient documentation

## 2021-05-28 DIAGNOSIS — K317 Polyp of stomach and duodenum: Secondary | ICD-10-CM | POA: Insufficient documentation

## 2021-05-28 DIAGNOSIS — K5900 Constipation, unspecified: Secondary | ICD-10-CM | POA: Insufficient documentation

## 2021-05-28 DIAGNOSIS — K921 Melena: Secondary | ICD-10-CM

## 2021-05-28 DIAGNOSIS — K219 Gastro-esophageal reflux disease without esophagitis: Secondary | ICD-10-CM | POA: Insufficient documentation

## 2021-05-28 DIAGNOSIS — R519 Headache, unspecified: Secondary | ICD-10-CM | POA: Diagnosis not present

## 2021-05-28 HISTORY — PX: BIOPSY: SHX5522

## 2021-05-28 HISTORY — PX: COLONOSCOPY WITH PROPOFOL: SHX5780

## 2021-05-28 HISTORY — PX: ESOPHAGOGASTRODUODENOSCOPY (EGD) WITH PROPOFOL: SHX5813

## 2021-05-28 HISTORY — PX: MALONEY DILATION: SHX5535

## 2021-05-28 LAB — GLUCOSE, CAPILLARY: Glucose-Capillary: 82 mg/dL (ref 70–99)

## 2021-05-28 SURGERY — COLONOSCOPY WITH PROPOFOL
Anesthesia: General

## 2021-05-28 MED ORDER — PROPOFOL 1000 MG/100ML IV EMUL
INTRAVENOUS | Status: AC
  Filled 2021-05-28: qty 100

## 2021-05-28 MED ORDER — PROPOFOL 10 MG/ML IV BOLUS
INTRAVENOUS | Status: DC | PRN
  Administered 2021-05-28: 100 mg via INTRAVENOUS

## 2021-05-28 MED ORDER — LACTATED RINGERS IV SOLN: INTRAVENOUS | Status: DC

## 2021-05-28 MED ORDER — PROPOFOL 500 MG/50ML IV EMUL
INTRAVENOUS | Status: DC | PRN
  Administered 2021-05-28: 150 ug/kg/min via INTRAVENOUS

## 2021-05-28 NOTE — Op Note (Signed)
[facility] ?Patient Name: Ashley Hickman ?Procedure Date: 05/28/2021 7:09 AM ?MRN: [id number] ?Date of Birth: 1961-01-27 ?Attending MD: Norvel Richards , MD ?CSN: [id number] ?Age: 61 ?Admit Type: Outpatient ?Procedure:                Upper GI endoscopy ?Indications:              Dysphagia ?Providers:                Norvel Richards, MD, Janeece Riggers, RN, Eugene Garnet  ?                          Shanon Brow, Technician ?Referring MD:              ?Medicines:                Propofol per Anesthesia ?Complications:            No immediate complications. ?Estimated Blood Loss:     Estimated blood loss was minimal. ?Procedure:                Pre-Anesthesia Assessment: ?                          - Prior to the procedure, a History and Physical  ?                          was performed, and patient medications and  ?                          allergies were reviewed. The patient's tolerance of  ?                          previous anesthesia was also reviewed. The risks  ?                          and benefits of the procedure and the sedation  ?                          options and risks were discussed with the patient.  ?                          All questions were answered, and informed consent  ?                          was obtained. Prior Anticoagulants: The patient has  ?                          taken no previous anticoagulant or antiplatelet  ?                          agents. ASA Grade Assessment: II - A patient with  ?                          mild systemic disease. After reviewing the risks  ?  and benefits, the patient was deemed in  ?                          satisfactory condition to undergo the procedure. ?                          After obtaining informed consent, the endoscope was  ?                          passed under direct vision. Throughout the  ?                          procedure, the patient's blood pressure, pulse, and  ?                          oxygen saturations were  monitored continuously. The  ?                          [id number] scope was introduced through the  ?                          mouth, and advanced to the second part of duodenum.  ?                          The upper GI endoscopy was accomplished without  ?                          difficulty. The patient tolerated the procedure  ?                          well. ?Scope In: 7:38:38 AM ?Scope Out: 7:50:30 AM ?Total Procedure Duration: 0 hours 11 minutes 52 seconds  ?Findings: ?     The examined esophagus was normal. Normal-appearing stomach except for  ?     scattered 1 to 3 mm fundal gland appearing polyps studding the gastric  ?     body and fundus. No ulcer or infiltrating process. Patent pylorus. ?     The duodenal bulb and second portion of the duodenum were normal. The  ?     scope was withdrawn. Dilation was performed with a Maloney dilator with  ?     mild resistance at 73 Fr. The dilation site was examined following  ?     endoscope reinsertion and showed no change. Estimated blood loss: none.  ?     Finally, the mid and distal esophagus was biopsied for histologic study. ?Impression:               - Normal esophagus. Dilated and biopsied. Gastric  ?                          polyps status post biopsy ?                          - Normal duodenal bulb and second portion of the  ?  duodenum. Follow-up on pathology. Further  ?                          recommendations. See colonoscopy report ?Moderate Sedation: ?     Moderate (conscious) sedation was personally administered by an  ?     anesthesia professional. The following parameters were monitored: oxygen  ?     saturation, heart rate, blood pressure, respiratory rate, EKG, adequacy  ?     of pulmonary ventilation, and response to care. ?Recommendation:           - Patient has a contact number available for  ?                          emergencies. The signs and symptoms of potential  ?                          delayed  complications were discussed with the  ?                          patient. Return to normal activities tomorrow.  ?                          Written discharge instructions were provided to the  ?                          patient. ?                          - Advance diet as tolerated. ?                          - Continue present medications. ?                          - Return to my office in 1 month. ?Procedure Code(s):        --- Professional --- ?                          [phone], Esophagogastroduodenoscopy, flexible,  ?                          transoral; diagnostic, including collection of  ?                          specimen(s) by brushing or washing, when performed  ?                          (separate procedure) ?                          [id number], Dilation of esophagus, by unguided sound or  ?                          bougie, single or multiple passes ?Diagnosis Code(s):        --- Professional --- ?  R13.10, Dysphagia, unspecified ?CPT copyright 2019 American Medical Association. All rights reserved. ?The codes documented in this report are preliminary and upon coder review may  ?be revised to meet current compliance requirements. ?Cristopher Estimable. Maitlyn Penza, MD ?Norvel Richards, MD ?05/28/2021 8:09:10 AM ?This report has been signed electronically. ?Number of Addenda: 0 ?

## 2021-05-28 NOTE — Anesthesia Preprocedure Evaluation (Addendum)
Anesthesia Evaluation  ?Patient identified by MRN, date of birth, ID band ?Patient awake ? ? ? ?Reviewed: ?Allergy & Precautions, NPO status , Patient's Chart, lab work & pertinent test results, reviewed documented beta blocker date and time  ? ?Airway ?Mallampati: III ? ?TM Distance: >3 FB ?Neck ROM: Full ? ?Mouth opening: Limited Mouth Opening ? Dental ? ?(+) Dental Advisory Given, Missing ?  ?Pulmonary ?sleep apnea (resolved after losing weight) ,  ?  ?Pulmonary exam normal ?breath sounds clear to auscultation ? ? ? ? ? ? Cardiovascular ?Exercise Tolerance: Poor ?METS (severe hip and back pain): hypertension, Pt. on medications and Pt. on home beta blockers ?Normal cardiovascular exam ?Rhythm:Regular Rate:Normal ? ? ?  ?Neuro/Psych ? Headaches, PSYCHIATRIC DISORDERS Depression   ? GI/Hepatic ?GERD  Medicated and Controlled,  ?Endo/Other  ?diabetes, Well Controlled, Type 2, Oral Hypoglycemic Agents ? Renal/GU ?Renal InsufficiencyRenal disease  ? ?  ?Musculoskeletal ?negative musculoskeletal ROS ?(+)  ? Abdominal ?  ?Peds ? Hematology ?negative hematology ROS ?(+)   ?Anesthesia Other Findings ? ? Reproductive/Obstetrics ?negative OB ROS ? ?  ? ? ? ? ? ? ? ? ? ? ? ? ? ?  ?  ? ? ? ? ? ? ? ?Anesthesia Physical ?Anesthesia Plan ? ?ASA: 2 ? ?Anesthesia Plan: General  ? ?Post-op Pain Management: Minimal or no pain anticipated  ? ?Induction: Intravenous ? ?PONV Risk Score and Plan: Propofol infusion and TIVA ? ?Airway Management Planned: Nasal Cannula and Natural Airway ? ?Additional Equipment:  ? ?Intra-op Plan:  ? ?Post-operative Plan:  ? ?Informed Consent: I have reviewed the patients History and Physical, chart, labs and discussed the procedure including the risks, benefits and alternatives for the proposed anesthesia with the patient or authorized representative who has indicated his/her understanding and acceptance.  ? ? ? ?Dental advisory given ? ?Plan Discussed with: CRNA and  Surgeon ? ?Anesthesia Plan Comments:   ? ? ? ? ? ? ?Anesthesia Quick Evaluation ? ?

## 2021-05-28 NOTE — Op Note (Signed)
[facility] ?Patient Name: Ashley Hickman ?Procedure Date: 05/28/2021 7:08 AM ?MRN: [id number] ?Date of Birth: 1961-01-18 ?Attending MD: Norvel Richards , MD ?CSN: [id number] ?Age: 61 ?Admit Type: Outpatient ?Procedure:                Colonoscopy ?Indications:              Hematochezia ?Providers:                Norvel Richards, MD, Janeece Riggers, RN, Eugene Garnet  ?                          Shanon Brow, Technician ?Referring MD:              ?Medicines:                Propofol per Anesthesia ?Complications:            No immediate complications. ?Estimated Blood Loss:     Estimated blood loss: none. ?Procedure:                Pre-Anesthesia Assessment: ?                          - Prior to the procedure, a History and Physical  ?                          was performed, and patient medications and  ?                          allergies were reviewed. The patient's tolerance of  ?                          previous anesthesia was also reviewed. The risks  ?                          and benefits of the procedure and the sedation  ?                          options and risks were discussed with the patient.  ?                          All questions were answered, and informed consent  ?                          was obtained. Prior Anticoagulants: The patient has  ?                          taken no previous anticoagulant or antiplatelet  ?                          agents. ASA Grade Assessment: II - A patient with  ?                          mild systemic disease. After reviewing the risks  ?                          and  benefits, the patient was deemed in  ?                          satisfactory condition to undergo the procedure. ?                          After obtaining informed consent, the colonoscope  ?                          was passed under direct vision. Throughout the  ?                          procedure, the patient's blood pressure, pulse, and  ?                          oxygen saturations were monitored  continuously. The  ?                          [id number]) scope was introduced through the  ?                          anus and advanced to the the distal transverse  ?                          colon. The colonoscopy was performed without  ?                          difficulty. An adequate preparation. ?Scope In: 7:55:51 AM ?Scope Out: 8:03:14 AM ?Total Procedure Duration: 0 hours 7 minutes 23 seconds  ?Findings: ?     The perianal and digital rectal examinations were normal. ?     Quite a bit of effluent present with vaginal matter viscous stool made  ?     exam more challenging. I lavaged and washed the left colon. Thought I  ?     was going to over come it. By the time I reached the transverse colon it  ?     was apparent that prep was inadequate. Procedure aborted. ?Impression:               - Inadequate preparation. Colonic mucosa  ?                          incompletely seen. ?Moderate Sedation: ?     Moderate (conscious) sedation was personally administered by an  ?     anesthesia professional. The following parameters were monitored: oxygen  ?     saturation, heart rate, blood pressure, and response to care. ?Recommendation:           - Patient has a contact number available for  ?                          emergencies. The signs and symptoms of potential  ?                          delayed complications were discussed with the  ?  patient. Return to normal activities tomorrow.  ?                          Written discharge instructions were provided to the  ?                          patient. ?                          - Advance diet as tolerated. Return to the office  ?                          in 1 month to get rescheduled/reprepped. See EGD  ?                          report. ?Procedure Code(s):        --- Professional --- ?                          [phone], Colonoscopy, flexible; diagnostic, including  ?                          collection of specimen(s) by brushing or  washing,  ?                          when performed (separate procedure) ?Diagnosis Code(s):        --- Professional --- ?                          K92.1, Melena (includes Hematochezia) ?CPT copyright 2019 American Medical Association. All rights reserved. ?The codes documented in this report are preliminary and upon coder review may  ?be revised to meet current compliance requirements. ?Cristopher Estimable. Darrel Gloss, MD ?Norvel Richards, MD ?05/28/2021 8:12:49 AM ?This report has been signed electronically. ?Number of Addenda: 0 ?

## 2021-05-28 NOTE — H&P (Signed)
?@LOGO @ ? ? ?Primary Care Physician:  , NP ?Primary Gastroenterologist:  Dr. Janean Sark ? ?Pre-Procedure History & Physical: ?HPI:  Ashley Hickman is a 61 y.o. female here for further evaluation of esophageal dysphagia, rectal bleeding s.  She is here for EGD possible EGD and colonoscopy. ?Family history of "precancerous polyps". ? ?Past Medical History:  ?Diagnosis Date  ? CKD (chronic kidney disease)   ? Depression   ? GERD (gastroesophageal reflux disease)   ? Hypertension   ? Migraine   ? T2DM (type 2 diabetes mellitus) (HCC)   ? ? ?Past Surgical History:  ?Procedure Laterality Date  ? ABDOMINAL HYSTERECTOMY    ? CESAREAN SECTION    ? PLACEMENT OF BREAST IMPLANTS    ? SHOULDER SURGERY Right   ? TONSILLECTOMY    ? ? ?Prior to Admission medications   ?Medication Sig Start Date End Date Taking? Authorizing Provider  ?ARIPiprazole (ABILIFY) 15 MG tablet Take 15 mg by mouth daily.   Yes [provider]  ?Atogepant (QULIPTA) 60 MG TABS Take 60 mg by mouth daily.   Yes [provider]  ?B Complex-C (SUPER B-COMPLEX + VITAMIN C) TABS Take 1 tablet by mouth daily.   Yes [provider]  ?Calcium Citrate-Vitamin D 315-5 MG-MCG TABS Take 2 tablets by mouth daily.   Yes [provider]  ?Cholecalciferol (VITAMIN D3) 25 MCG (1000 UT) CAPS Take 1,000 Units by mouth daily.   Yes [provider]  ?eszopiclone (LUNESTA) 2 MG TABS tablet Take 2 mg by mouth at bedtime.   Yes [provider]  ?Flaxseed, Linseed, (FLAXSEED OIL) 1200 MG CAPS Take 1,200 mg by mouth daily.   Yes [provider]  ?folic acid (FOLVITE) 800 MCG tablet Take 800 mcg by mouth daily.   Yes [provider]  ?HYDROcodone-acetaminophen (NORCO) 10-325 MG tablet Take 1 tablet by mouth every 6 (six) hours as needed for severe pain. 03/15/21  Yes [provider]  ?JANUVIA 50 MG tablet Take 50 mg by mouth daily. 03/23/21  Yes [provider]  ?lisinopril (ZESTRIL) 20  MG tablet Take 20 mg by mouth in the morning and at bedtime. 03/30/21  Yes [provider]  ?Magnesium Citrate 100 MG CAPS Take 300 mg by mouth daily.   Yes [provider]  ?memantine (NAMENDA) 10 MG tablet Take 10 mg by mouth daily.   Yes [provider]  ?methocarbamol (ROBAXIN) 500 MG tablet Take 500 mg by mouth 3 (three) times daily as needed for muscle spasms. 12/04/20  Yes [provider]  ?nitrofurantoin (MACRODANTIN) 100 MG capsule Take 100 mg by mouth at bedtime as needed (UTI).   Yes [provider]  ?omeprazole (PRILOSEC) 40 MG capsule Take 40 mg by mouth daily.   Yes [provider]  ?Probiotic Product (PROBIOTIC DAILY PO) Take 1 capsule by mouth daily.   Yes [provider]  ?propranolol (INDERAL) 10 MG tablet Take 20 mg by mouth 2 (two) times daily. 03/29/21  Yes [provider]  ?rizatriptan (MAXALT) 10 MG tablet Take 10 mg by mouth as needed for migraine. May repeat in 2 hours if needed   Yes [provider]  ?rosuvastatin (CRESTOR) 20 MG tablet Take 20 mg by mouth at bedtime. 03/29/21  Yes [provider]  ?tolterodine (DETROL LA) 4 MG 24 hr capsule Take 4 mg by mouth daily. 03/30/21  Yes [provider]  ?TURMERIC PO Take 2 each by mouth daily. With Ginger  Yes [provider]  ?Venlafaxine HCl 225 MG TB24 Take 225 mg by mouth daily.   Yes [provider]  ?EASYMAX TEST test strip USE AS DIRECTED testing ONCE A DAY varying TIMES (fasting AND 2 HOURS AFTER largest MEAL) 02/07/21   [provider]  ?Embrace Lancets Ultra Thin 30G MISC USE AS DIRECTED ONCE DAILY testing AT varying TIMES (fasting AND 2 HOURS AFTER largest MEAL) 02/21/21   [provider]  ?glucose blood (EASYMAX TEST) test strip USE AS DIRECTED testing ONCE A DAY varying TIMES (fasting AND 2 HOURS AFTER largest MEAL) 01/24/20   [provider]  ?hydrocortisone (ANUSOL-HC) 2.5 % rectal cream Place 1  application. rectally 2 (two) times daily. ?Patient not taking: Reported on 05/23/2021 04/11/21   Letta Median, PA-C  ? ? ?Allergies as of 04/12/2021 - Review Complete 04/11/2021  ?Allergen Reaction Noted  ? Penicillins  12/10/2015  ? Sulfa antibiotics  12/10/2015  ? ? ?Family History  ?Problem Relation Age of Onset  ? Colon polyps Sister   ?     pre-cancerous, numerous, required partial colectomy  ? ? ?Social History  ? ?Socioeconomic History  ? Marital status: Married  ?  Spouse name: Not on file  ? Number of children: Not on file  ? Years of education: Not on file  ? Highest education level: Not on file  ?Occupational History  ? Not on file  ?Tobacco Use  ? Smoking status: Never  ? Smokeless tobacco: Never  ?Vaping Use  ? Vaping Use: Never used  ?Substance and Sexual Activity  ? Alcohol use: Yes  ?  Comment: occasionally  ? Drug use: No  ? Sexual activity: Not on file  ?Other Topics Concern  ? Not on file  ?Social History Narrative  ? Not on file  ? ?Social Determinants of Health  ? ?Financial Resource Strain: Not on file  ?Food Insecurity: Not on file  ?Transportation Needs: Not on file  ?Physical Activity: Not on file  ?Stress: Not on file  ?Social Connections: Not on file  ?Intimate Partner Violence: Not on file  ? ? ?Review of Systems: ?See HPI, otherwise negative ROS ? ?Physical Exam: ?BP (!) 143/75   Temp 98.7 ?F (37.1 ?C) (Oral)   Resp 16   Ht 5' (1.524 m)   Wt 62.1 kg   SpO2 97%   BMI 26.76 kg/m?  ?General:   Alert,  Well-developed, well-nourished, pleasant and cooperative in NAD ?Skin:  Intact without significant lesions or rashes. ?Neck:  Supple; no masses or thyromegaly. No significant cervical adenopathy. ?Lungs:  Clear throughout to auscultation.   No wheezes, crackles, or rhonchi. No acute distress. ?Heart:  Regular rate and rhythm; no murmurs, clicks, rubs,  or gallops. ?Abdomen: Non-distended, normal bowel sounds.  Soft and nontender without appreciable mass or hepatosplenomegaly.   ?Pulses:  Normal pulses noted. ?Extremities:  Without clubbing or edema. ? ?Impression/Plan: 61 year old lady with intermittent rectal bleeding. .  Esophageal dysphagia in the setting of GERD. ? ?Recommendations I have offered the patient an EGD with possible esophageal dilation as feasible/appropriate per plan as well as a diagnostic colonoscopy. ? ?The risks, benefits, limitations, imponderables and alternatives regarding both EGD and colonoscopy have been reviewed with the patient. Questions have been answered. All parties agreeable.   ? ? ? ? ?Notice: This dictation was prepared with Dragon dictation along with smaller phrase technology. Any transcriptional errors that result from this process are unintentional and may not be corrected upon review.  ?

## 2021-05-28 NOTE — Discharge Instructions (Signed)
?Colonoscopy ?Discharge Instructions ? ?Read the instructions outlined below and refer to this sheet in the next few weeks. These discharge instructions provide you with general information on caring for yourself after you leave the hospital. Your doctor may also give you specific instructions. While your treatment has been planned according to the most current medical practices available, unavoidable complications occasionally occur. If you have any problems or questions after discharge, call Dr. Gala Romney at [phone]. ?ACTIVITY ?You may resume your regular activity, but move at a slower pace for the next 24 hours.  ?Take frequent rest periods for the next 24 hours.  ?Walking will help get rid of the air and reduce the bloated feeling in your belly (abdomen).  ?No driving for 24 hours (because of the medicine (anesthesia) used during the test).   ?Do not sign any important legal documents or operate any machinery for 24 hours (because of the anesthesia used during the test).  ?NUTRITION ?Drink plenty of fluids.  ?You may resume your normal diet as instructed by your doctor.  ?Begin with a light meal and progress to your normal diet. Heavy or fried foods are harder to digest and may make you feel sick to your stomach (nauseated).  ?Avoid alcoholic beverages for 24 hours or as instructed.  ?MEDICATIONS ?You may resume your normal medications unless your doctor tells you otherwise.  ?WHAT YOU CAN EXPECT TODAY ?Some feelings of bloating in the abdomen.  ?Passage of more gas than usual.  ?Spotting of blood in your stool or on the toilet paper.  ?IF YOU HAD POLYPS REMOVED DURING THE COLONOSCOPY: ?No aspirin products for 7 days or as instructed.  ?No alcohol for 7 days or as instructed.  ?Eat a soft diet for the next 24 hours.  ?FINDING OUT THE RESULTS OF YOUR TEST ?Not all test results are available during your visit. If your test results are not back during the visit, make an appointment with your caregiver to find out the  results. Do not assume everything is normal if you have not heard from your caregiver or the medical facility. It is important for you to follow up on all of your test results.  ?SEEK IMMEDIATE MEDICAL ATTENTION IF: ?You have more than a spotting of blood in your stool.  ?Your belly is swollen (abdominal distention).  ?You are nauseated or vomiting.  ?You have a temperature over 101.  ?You have abdominal pain or discomfort that is severe or gets worse throughout the day.   ?EGD ?Discharge instructions ?Please read the instructions outlined below and refer to this sheet in the next few weeks. These discharge instructions provide you with general information on caring for yourself after you leave the hospital. Your doctor may also give you specific instructions. While your treatment has been planned according to the most current medical practices available, unavoidable complications occasionally occur. If you have any problems or questions after discharge, please call your doctor. ?ACTIVITY ?You may resume your regular activity but move at a slower pace for the next 24 hours.  ?Take frequent rest periods for the next 24 hours.  ?Walking will help expel (get rid of) the air and reduce the bloated feeling in your abdomen.  ?No driving for 24 hours (because of the anesthesia (medicine) used during the test).  ?You may shower.  ?Do not sign any important legal documents or operate any machinery for 24 hours (because of the anesthesia used during the test).  ?NUTRITION ?Drink plenty of fluids.  ?You may  resume your normal diet.  ?Begin with a light meal and progress to your normal diet.  ?Avoid alcoholic beverages for 24 hours or as instructed by your caregiver.  ?MEDICATIONS ?You may resume your normal medications unless your caregiver tells you otherwise.  ?WHAT YOU CAN EXPECT TODAY ?You may experience abdominal discomfort such as a feeling of fullness or ?gas? pains.  ?FOLLOW-UP ?Your doctor will discuss the results of  your test with you.  ?SEEK IMMEDIATE MEDICAL ATTENTION IF ANY OF THE FOLLOWING OCCUR: ?Excessive nausea (feeling sick to your stomach) and/or vomiting.  ?Severe abdominal pain and distention (swelling).  ?Trouble swallowing.  ?Temperature over 101? F (37.8? C).  ?Rectal bleeding or vomiting of blood.   ? ? ?Your esophagus was stretched and biopsied today ? ?1 polyp in your stomach removed ? ?Your colonoscopy could not be completed due to poor prep ? ?Office visit with [place] in 1 month set up a repeat colonoscopy ? ?Further recommendations to follow once lab report back for review ? ?At patient request, I called Jeannett Senior at [phone] -reviewed findings and recommendations ? ? ?

## 2021-05-28 NOTE — Telephone Encounter (Signed)
Per Dr. Jena Gauss, colon not prepped. Will r/s TCS when she comes in for her OV next month ?

## 2021-05-28 NOTE — Anesthesia Postprocedure Evaluation (Signed)
Anesthesia Post Note ? ?Patient: Ashley Hickman ? ?Procedure(s) Performed: COLONOSCOPY WITH PROPOFOL ?ESOPHAGOGASTRODUODENOSCOPY (EGD) WITH PROPOFOL ?MALONEY DILATION ?BIOPSY ? ?Patient location during evaluation: Endoscopy ?Anesthesia Type: General ?Level of consciousness: awake and alert and oriented ?Pain management: pain level controlled ?Vital Signs Assessment: post-procedure vital signs reviewed and stable ?Respiratory status: spontaneous breathing, nonlabored ventilation and respiratory function stable ?Cardiovascular status: blood pressure returned to baseline and stable ?Postop Assessment: no apparent nausea or vomiting ?Anesthetic complications: no ? ? ?No notable events documented. ? ? ?Last Vitals:  ?Vitals:  ? 05/28/21 0654 05/28/21 0807  ?BP: (!) 143/75 (!) 118/52  ?Pulse:  72  ?Resp: 16 18  ?Temp: 37.1 ?C 36.4 ?C  ?SpO2: 97% 96%  ?  ?Last Pain:  ?Vitals:  ? 05/28/21 0807  ?TempSrc: Oral  ?PainSc: 0-No pain  ? ? ?  ?  ?  ?  ?  ?  ? ?Aidric Endicott C Dorlisa Savino ? ? ? ? ?

## 2021-05-28 NOTE — Transfer of Care (Signed)
Immediate Anesthesia Transfer of Care Note ? ?Patient: Ashley Hickman ? ?Procedure(s) Performed: COLONOSCOPY WITH PROPOFOL ?ESOPHAGOGASTRODUODENOSCOPY (EGD) WITH PROPOFOL ?MALONEY DILATION ?BIOPSY ? ?Patient Location: PACU ? ?Anesthesia Type:General ? ?Level of Consciousness: awake, alert , oriented and patient cooperative ? ?Airway & Oxygen Therapy: Patient Spontanous Breathing ? ?Post-op Assessment: Report given to RN, Post -op Vital signs reviewed and stable and Patient moving all extremities X 4 ? ?Post vital signs: Reviewed and stable ? ?Last Vitals:  ?Vitals Value Taken Time  ?BP 118/52 05/28/21 0807  ?Temp 36.4 ?C 05/28/21 0807  ?Pulse 72 05/28/21 0807  ?Resp 18 05/28/21 0807  ?SpO2 96 % 05/28/21 0807  ? ? ?Last Pain:  ?Vitals:  ? 05/28/21 0807  ?TempSrc: Oral  ?PainSc: 0-No pain  ?   ? ?Patients Stated Pain Goal: 6 (05/28/21 0654) ? ?Complications: No notable events documented. ?

## 2021-05-29 ENCOUNTER — Encounter: Payer: Self-pay | Admitting: Internal Medicine

## 2021-05-29 LAB — SURGICAL PATHOLOGY

## 2021-05-31 ENCOUNTER — Ambulatory Visit: Payer: BC Managed Care – PPO

## 2021-06-01 ENCOUNTER — Encounter (HOSPITAL_COMMUNITY): Payer: Self-pay | Admitting: Internal Medicine

## 2021-06-26 ENCOUNTER — Ambulatory Visit: Payer: BC Managed Care – PPO | Admitting: Internal Medicine

## 2021-06-26 ENCOUNTER — Encounter: Payer: Self-pay | Admitting: Internal Medicine

## 2021-06-26 VITALS — BP 133/83 | HR 72 | Temp 97.8°F | Ht 60.0 in | Wt 139.6 lb

## 2021-06-26 DIAGNOSIS — R1310 Dysphagia, unspecified: Secondary | ICD-10-CM

## 2021-06-26 DIAGNOSIS — K625 Hemorrhage of anus and rectum: Secondary | ICD-10-CM | POA: Diagnosis not present

## 2021-06-26 DIAGNOSIS — K219 Gastro-esophageal reflux disease without esophagitis: Secondary | ICD-10-CM

## 2021-06-26 NOTE — Patient Instructions (Signed)
It was good to see you again today  We will provide a 2-day clear liquid prep prior to colonoscopy.  We will utilize Clenpiq  Linzess gelcap 145 1 daily 3 days prior to the colonoscopy  2 tapwater enemas the morning of the colonoscopy  No Januvia day before or day of the colonoscopy  We will schedule a colonoscopy for rectal bleeding/positive family history of colon polyps in the near future.  ASA 3  Continue omeprazole 40 mg daily for GERD

## 2021-06-26 NOTE — Progress Notes (Signed)
Primary Care Physician:  Janean Sark, NP Primary Gastroenterologist:  Dr. Jena Gauss  Pre-Procedure History & Physical: HPI:  Ashley Hickman is a 61 y.o. female here for follow-up of dysphagia.  No dysphagia after undergoing empiric dilation recently.  Attempted colonoscopy thwarted by poor prep.  She is here to re-prep/reschedule.  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  GERD well-controlled on omeprazole 40 mg daily.  Negative colonoscopy 7 to 10 years ago in [place].  Older sister just had her first colonoscopy and had multiple colonic polyps; had to have a segmental resection due to precancerous colon polyps.  Past Medical History:  Diagnosis Date   CKD (chronic kidney disease)    Depression    GERD (gastroesophageal reflux disease)    Hypertension    Migraine    T2DM (type 2 diabetes mellitus) (HCC)     Past Surgical History:  Procedure Laterality Date   ABDOMINAL HYSTERECTOMY     BIOPSY  05/28/2021   Procedure: BIOPSY;  Surgeon: Corbin Ade, MD;  Location: [facility];  Service: Endoscopy;;   CESAREAN SECTION     COLONOSCOPY WITH PROPOFOL N/A 05/28/2021   Procedure: COLONOSCOPY WITH PROPOFOL;  Surgeon: Corbin Ade, MD;  Location: [facility];  Service: Endoscopy;  Laterality: N/A;  7:30am   ESOPHAGOGASTRODUODENOSCOPY (EGD) WITH PROPOFOL N/A 05/28/2021   Procedure: ESOPHAGOGASTRODUODENOSCOPY (EGD) WITH PROPOFOL;  Surgeon: Corbin Ade, MD;  [facility]: [facility];  Service: Endoscopy;  Laterality: N/A;   MALONEY DILATION N/A 05/28/2021   Procedure: Elease Hashimoto DILATION;  Surgeon: Corbin Ade, MD;  Location: [facility];  Service: Endoscopy;  Laterality: N/A;   PLACEMENT OF BREAST IMPLANTS     SHOULDER SURGERY Right    TONSILLECTOMY      Prior to Admission medications   Medication Sig Start Date End Date Taking? Authorizing Provider  ARIPiprazole (ABILIFY) 15 MG tablet Take 15 mg  by mouth daily.   Yes [provider]  Atogepant (QULIPTA) 60 MG TABS Take 60 mg by mouth daily.   Yes [provider]  B Complex-C (SUPER B-COMPLEX + VITAMIN C) TABS Take 1 tablet by mouth daily.   Yes [provider]  Biotin 1 MG CAPS Take by mouth.   Yes [provider]  Calcium Citrate-Vitamin D 315-5 MG-MCG TABS Take 2 tablets by mouth daily.   Yes [provider]  Cholecalciferol (VITAMIN D3) 25 MCG (1000 UT) CAPS Take 1,000 Units by mouth daily.   Yes [provider]  Embrace Lancets Ultra Thin 30G MISC USE AS DIRECTED ONCE DAILY testing AT varying TIMES (fasting AND 2 HOURS AFTER largest MEAL) 02/21/21  Yes [provider]  eszopiclone (LUNESTA) 2 MG TABS tablet Take 2 mg by mouth at bedtime.   Yes [provider]  Flaxseed, Linseed, (FLAXSEED OIL) 1200 MG CAPS Take 1,200 mg by mouth daily.   Yes [provider]  folic acid (FOLVITE) 800 MCG tablet Take 800 mcg by mouth daily.   Yes [provider]  glucose blood (EASYMAX TEST) test strip USE AS DIRECTED testing ONCE A DAY varying TIMES (fasting AND 2 HOURS AFTER largest MEAL) 01/24/20  Yes [provider]  HYDROcodone-acetaminophen (NORCO) 10-325 MG tablet Take 1 tablet by mouth every 6 (six) hours as needed for severe pain. 03/15/21  Yes [provider]  JANUVIA 50 MG tablet Take 50 mg by mouth daily.  03/23/21  Yes [provider]  lisinopril (ZESTRIL) 20 MG tablet Take 20 mg by mouth in the morning and at bedtime. 03/30/21  Yes [provider]  memantine (NAMENDA) 10 MG tablet Take 10 mg by mouth daily.   Yes [provider]  methocarbamol (ROBAXIN) 500 MG tablet Take 500 mg by mouth 3 (three) times daily as needed for muscle spasms. 12/04/20  Yes [provider]  nitrofurantoin (MACRODANTIN) 100 MG capsule Take 100 mg by mouth at bedtime as needed (UTI).   Yes [provider]  omeprazole  (PRILOSEC) 40 MG capsule Take 40 mg by mouth daily.   Yes [provider]  Probiotic Product (PROBIOTIC DAILY PO) Take 1 capsule by mouth daily.   Yes [provider]  propranolol (INDERAL) 10 MG tablet Take 20 mg by mouth 2 (two) times daily. 03/29/21  Yes [provider]  rizatriptan (MAXALT) 10 MG tablet Take 10 mg by mouth as needed for migraine. May repeat in 2 hours if needed   Yes [provider]  rosuvastatin (CRESTOR) 20 MG tablet Take 20 mg by mouth at bedtime. 03/29/21  Yes [provider]  tolterodine (DETROL LA) 4 MG 24 hr capsule Take 4 mg by mouth daily. 03/30/21  Yes [provider]  topiramate (TOPAMAX) 100 MG tablet Take by mouth.   Yes [provider]  TURMERIC PO Take 2 each by mouth daily. With Ginger   Yes [provider]  Venlafaxine HCl 225 MG TB24 Take 225 mg by mouth daily.   Yes [provider]    Allergies as of 06/26/2021 - Review Complete 06/26/2021  Allergen Reaction Noted   Sulfa antibiotics  12/10/2015   Penicillins Rash 12/10/2015    Family History  Problem Relation Age of Onset   Colon polyps Sister        pre-cancerous, numerous, required partial colectomy    Social History   Socioeconomic History   Marital status: Married    Spouse name: Not on file   Number of children: Not on file   Years of education: Not on file   Highest education level: Not on file  Occupational History   Not on file  Tobacco Use   Smoking status: Never   Smokeless tobacco: Never  Vaping Use   Vaping Use: Never used  Substance and Sexual Activity   Alcohol use: Yes    Comment: occasionally   Drug use: No   Sexual activity: Yes  Other Topics Concern   Not on file  Social History Narrative   Not on file   Social Determinants of Health   Financial Resource Strain: Not on file  Food Insecurity: Not on file  Transportation Needs: Not on file  Physical Activity: Not on file  Stress:  Not on file  Social Connections: Not on file  Intimate Partner Violence: Not on file    Review of Systems: See HPI, otherwise negative ROS  Physical Exam: BP 133/83 (BP Location: Left Arm, Patient Position: Sitting, Cuff Size: Normal)   Pulse 72   Temp 97.8 F (36.6 C) (Temporal)   Ht 5' (1.524 m)   Wt 139 lb 9.6 oz (63.3 kg)   SpO2 99%   BMI 27.26 kg/m  General:   Alert,  Well-developed, well-nourished, pleasant and cooperative in NAD Neck:  Supple; no masses or thyromegaly. No significant cervical adenopathy. Lungs:  Clear throughout to auscultation.   No wheezes, crackles, or rhonchi. No acute distress. Heart:  Regular  rate and rhythm; no murmurs, clicks, rubs,  or gallops. Abdomen: Non-distended, normal bowel sounds.  Soft and nontender without appreciable mass or hepatosplenomegaly.  Pulses:  Normal pulses noted. Extremities:  Without clubbing or edema.  Impression/Plan: 61 year old lady with paper hematochezia in the setting of a positive family history of precancerous colon polyps.  Attempted colonoscopy recently thwarted by poor prep.  Poor prep likely multifactorial in origin.  Patient was compliant with instructions.  GERD well-controlled with omeprazole.  No dysphagia.  Recommendations:  I have offered to repeat diagnostic colonoscopy.The risks, benefits, limitations, alternatives and imponderables have been reviewed with the patient. Questions have been answered. All parties are agreeable.   We will provide a 2-day clear liquid prep prior to colonoscopy.  We will utilize Clenpiq  Linzess gelcap 145 1 daily 3 days prior to the colonoscopy  2 tapwater enemas the morning of the colonoscopy  No Januvia day before or day of the colonoscopy  We will schedule a colonoscopy for rectal bleeding/positive family history of colon polyps in the near future.  ASA 3  Continue omeprazole 40 mg daily for GERD   Notice: This dictation was prepared with Dragon dictation along  with smaller phrase technology. Any transcriptional errors that result from this process are unintentional and may not be corrected upon review.

## 2021-06-28 ENCOUNTER — Telehealth: Payer: Self-pay | Admitting: *Deleted

## 2021-06-28 NOTE — Telephone Encounter (Signed)
Called pt, LMOVM to call back to schedule TCS with propofol asa 3, Dr. Jena Gauss

## 2021-06-29 ENCOUNTER — Encounter: Payer: Self-pay | Admitting: *Deleted

## 2021-06-29 ENCOUNTER — Other Ambulatory Visit: Payer: Self-pay | Admitting: Internal Medicine

## 2021-06-29 MED ORDER — PLENVU 140 G PO SOLR: 1.0000 | ORAL | 0 refills | Status: DC

## 2021-06-29 MED ORDER — CLENPIQ 10-3.5-12 MG-GM -GM/175ML PO SOLN: 1.0000 | ORAL | 0 refills | Status: DC

## 2021-06-29 NOTE — Telephone Encounter (Signed)
Fowarding to Dr. Jena Gauss. Per encounter form send on plenvu as she had previous inadequate prep with clenpiq.

## 2021-06-29 NOTE — Addendum Note (Signed)
Addended by: Armstead Peaks on: 06/29/2021 10:42 AM   Modules accepted: Orders

## 2021-06-29 NOTE — Telephone Encounter (Signed)
Moviprep also not covered by insurance. Do you want to try clenpiq again since she will have extended clears and on linzess prior?

## 2021-06-29 NOTE — Telephone Encounter (Signed)
Spoke with pt. Aware will send in clenpiq with extended instructions.

## 2021-06-29 NOTE — Telephone Encounter (Signed)
Pt called back. She has been scheduled for 7/20 at 1pm. Per encounter form give plenvu rx. Pt aware has extra instructions and will mail to her with pre-op appt.

## 2021-06-29 NOTE — Addendum Note (Signed)
Addended by: Armstead Peaks on: 06/29/2021 10:02 AM   Modules accepted: Orders

## 2021-08-20 NOTE — Patient Instructions (Signed)
Ashley Hickman  08/20/2021     @PREFPERIOPPHARMACY @   Your procedure is scheduled on  08/23/2021.   Report to [facility] at  1100  A.M.   Call this number if you have problems the morning of surgery:  [phone]   Remember:  Follow the diet and prep instructions given to you by the office.    DO NOT take any medications for diabetes the morning of your procedure.     Take these medicines the morning of surgery with A SIP OF WATER             abilify, quilipta, norco (if needed), namenda, robaxin (if needed), prilosec, inderal, venlafaxine.     Do not wear jewelry, make-up or nail polish.  Do not wear lotions, powders, or perfumes, or deodorant.  Do not shave 48 hours prior to surgery.  Men may shave face and neck.  Do not bring valuables to the hospital.  [facility] is not responsible for any belongings or valuables.  Contacts, dentures or bridgework may not be worn into surgery.  Leave your suitcase in the car.  After surgery it may be brought to your room.  For patients admitted to the hospital, discharge time will be determined by your treatment team.  Patients discharged the day of surgery will not be allowed to drive home and must have someone with them for 24 hours.    Special instructions:   DO NOT smoke tobacco or vape for 24 hours before your procedure.  Please read over the following fact sheets that you were given. Anesthesia Post-op Instructions and Care and Recovery After Surgery      Colonoscopy, Adult, Care After The following information offers guidance on how to care for yourself after your procedure. Your health care provider may also give you more specific instructions. If you have problems or questions, contact your health care provider. What can I expect after the procedure? After the procedure, it is common to have: A small amount of blood in your stool for 24 hours after the procedure. Some gas. Mild cramping or bloating of your  abdomen. Follow these instructions at home: Eating and drinking  Drink enough fluid to keep your urine pale yellow. Follow instructions from your health care provider about eating or drinking restrictions. Resume your normal diet as told by your health care provider. Avoid heavy or fried foods that are hard to digest. Activity Rest as told by your health care provider. Avoid sitting for a long time without moving. Get up to take short walks every 1-2 hours. This is important to improve blood flow and breathing. Ask for help if you feel weak or unsteady. Return to your normal activities as told by your health care provider. Ask your health care provider what activities are safe for you. Managing cramping and bloating  Try walking around when you have cramps or feel bloated. If directed, apply heat to your abdomen as told by your health care provider. Use the heat source that your health care provider recommends, such as a moist heat pack or a heating pad. Place a towel between your skin and the heat source. Leave the heat on for 20-30 minutes. Remove the heat if your skin turns bright red. This is especially important if you are unable to feel pain, heat, or cold. You have a greater risk of getting burned. General instructions If you were given a sedative during the procedure, it can affect you for  several hours. Do not drive or operate machinery until your health care provider says that it is safe. For the first 24 hours after the procedure: Do not sign important documents. Do not drink alcohol. Do your regular daily activities at a slower pace than normal. Eat soft foods that are easy to digest. Take over-the-counter and prescription medicines only as told by your health care provider. Keep all follow-up visits. This is important. Contact a health care provider if: You have blood in your stool 2-3 days after the procedure. Get help right away if: You have more than a small spotting of  blood in your stool. You have large blood clots in your stool. You have swelling of your abdomen. You have nausea or vomiting. You have a fever. You have increasing pain in your abdomen that is not relieved with medicine. These symptoms may be an emergency. Get help right away. Call 911. Do not wait to see if the symptoms will go away. Do not drive yourself to the hospital. Summary After the procedure, it is common to have a small amount of blood in your stool. You may also have mild cramping and bloating of your abdomen. If you were given a sedative during the procedure, it can affect you for several hours. Do not drive or operate machinery until your health care provider says that it is safe. Get help right away if you have a lot of blood in your stool, nausea or vomiting, a fever, or increased pain in your abdomen. This information is not intended to replace advice given to you by your health care provider. Make sure you discuss any questions you have with your health care provider. Document Revised: 09/13/2020 Document Reviewed: 09/13/2020 Elsevier Patient Education  2023 [employer] Monitored Anesthesia Care, Care After This sheet gives you information about how to care for yourself after your procedure. Your health care provider may also give you more specific instructions. If you have problems or questions, contact your health care provider. What can I expect after the procedure? After the procedure, it is common to have: Tiredness. Forgetfulness about what happened after the procedure. Impaired judgment for important decisions. Nausea or vomiting. Some difficulty with balance. Follow these instructions at home: For the time period you were told by your health care provider:     Rest as needed. Do not participate in activities where you could fall or become injured. Do not drive or use machinery. Do not drink alcohol. Do not take sleeping pills or medicines that cause  drowsiness. Do not make important decisions or sign legal documents. Do not take care of children on your own. Eating and drinking Follow the diet that is recommended by your health care provider. Drink enough fluid to keep your urine pale yellow. If you vomit: Drink water, juice, or soup when you can drink without vomiting. Make sure you have little or no nausea before eating solid foods. General instructions Have a responsible adult stay with you for the time you are told. It is important to have someone help care for you until you are awake and alert. Take over-the-counter and prescription medicines only as told by your health care provider. If you have sleep apnea, surgery and certain medicines can increase your risk for breathing problems. Follow instructions from your health care provider about wearing your sleep device: Anytime you are sleeping, including during daytime naps. While taking prescription pain medicines, sleeping medicines, or medicines that make you drowsy. Avoid smoking. Keep all follow-up  visits as told by your health care provider. This is important. Contact a health care provider if: You keep feeling nauseous or you keep vomiting. You feel light-headed. You are still sleepy or having trouble with balance after 24 hours. You develop a rash. You have a fever. You have redness or swelling around the IV site. Get help right away if: You have trouble breathing. You have new-onset confusion at home. Summary For several hours after your procedure, you may feel tired. You may also be forgetful and have poor judgment. Have a responsible adult stay with you for the time you are told. It is important to have someone help care for you until you are awake and alert. Rest as told. Do not drive or operate machinery. Do not drink alcohol or take sleeping pills. Get help right away if you have trouble breathing, or if you suddenly become confused. This information is not  intended to replace advice given to you by your health care provider. Make sure you discuss any questions you have with your health care provider. Document Revised: 12/26/2020 Document Reviewed: 12/24/2018 Elsevier Patient Education  [place].

## 2021-08-21 ENCOUNTER — Encounter (HOSPITAL_COMMUNITY): Payer: Self-pay

## 2021-08-21 ENCOUNTER — Encounter (HOSPITAL_COMMUNITY)
Admission: RE | Admit: 2021-08-21 | Discharge: 2021-08-21 | Disposition: A | Discharge status: Home / Self Care | Payer: BC Managed Care – PPO | Source: Ambulatory Visit | Attending: Internal Medicine | Admitting: Internal Medicine

## 2021-08-21 VITALS — BP 123/63 | HR 75 | Temp 97.8°F | Resp 18 | Ht 60.0 in | Wt 139.6 lb

## 2021-08-21 DIAGNOSIS — Z01818 Encounter for other preprocedural examination: Secondary | ICD-10-CM | POA: Insufficient documentation

## 2021-08-21 DIAGNOSIS — K5730 Diverticulosis of large intestine without perforation or abscess without bleeding: Secondary | ICD-10-CM | POA: Diagnosis not present

## 2021-08-21 DIAGNOSIS — G4730 Sleep apnea, unspecified: Secondary | ICD-10-CM | POA: Diagnosis not present

## 2021-08-21 DIAGNOSIS — I10 Essential (primary) hypertension: Secondary | ICD-10-CM

## 2021-08-21 DIAGNOSIS — E119 Type 2 diabetes mellitus without complications: Secondary | ICD-10-CM

## 2021-08-21 DIAGNOSIS — E1122 Type 2 diabetes mellitus with diabetic chronic kidney disease: Secondary | ICD-10-CM | POA: Diagnosis not present

## 2021-08-21 DIAGNOSIS — Z7984 Long term (current) use of oral hypoglycemic drugs: Secondary | ICD-10-CM | POA: Diagnosis not present

## 2021-08-21 DIAGNOSIS — K219 Gastro-esophageal reflux disease without esophagitis: Secondary | ICD-10-CM | POA: Diagnosis not present

## 2021-08-21 DIAGNOSIS — N189 Chronic kidney disease, unspecified: Secondary | ICD-10-CM | POA: Diagnosis not present

## 2021-08-21 DIAGNOSIS — I129 Hypertensive chronic kidney disease with stage 1 through stage 4 chronic kidney disease, or unspecified chronic kidney disease: Secondary | ICD-10-CM | POA: Diagnosis not present

## 2021-08-21 DIAGNOSIS — Q438 Other specified congenital malformations of intestine: Secondary | ICD-10-CM | POA: Diagnosis not present

## 2021-08-21 DIAGNOSIS — Z1211 Encounter for screening for malignant neoplasm of colon: Secondary | ICD-10-CM | POA: Diagnosis present

## 2021-08-21 DIAGNOSIS — Z8371 Family history of colonic polyps: Secondary | ICD-10-CM | POA: Diagnosis not present

## 2021-08-21 HISTORY — DX: Other specified postprocedural states: Z98.890

## 2021-08-21 HISTORY — DX: Sleep apnea, unspecified: G47.30

## 2021-08-21 LAB — BASIC METABOLIC PANEL
Anion gap: 6 (ref 5–15)
BUN: 17 mg/dL (ref 6–20)
CO2: 26 mmol/L (ref 22–32)
Calcium: 9.4 mg/dL (ref 8.9–10.3)
Chloride: 107 mmol/L (ref 98–111)
Creatinine, Ser: 0.8 mg/dL (ref 0.44–1.00)
GFR, Estimated: >60 mL/min (ref >60)
Glucose, Bld: 94 mg/dL (ref 70–99)
Potassium: 3.8 mmol/L (ref 3.5–5.1)
Sodium: 139 mmol/L (ref 135–145)

## 2021-08-23 ENCOUNTER — Ambulatory Visit (HOSPITAL_COMMUNITY): Payer: BC Managed Care – PPO | Admitting: Anesthesiology

## 2021-08-23 ENCOUNTER — Encounter (HOSPITAL_COMMUNITY): Payer: Self-pay | Admitting: Internal Medicine

## 2021-08-23 ENCOUNTER — Ambulatory Visit (HOSPITAL_COMMUNITY)
Admission: RE | Admit: 2021-08-23 | Discharge: 2021-08-23 | Disposition: A | Discharge status: Home / Self Care | Payer: BC Managed Care – PPO | Attending: Internal Medicine | Admitting: Internal Medicine

## 2021-08-23 ENCOUNTER — Encounter (HOSPITAL_COMMUNITY)
Admission: RE | Disposition: A | Discharge status: Home / Self Care | Payer: Self-pay | Source: Home / Self Care | Attending: Internal Medicine

## 2021-08-23 DIAGNOSIS — Z1211 Encounter for screening for malignant neoplasm of colon: Secondary | ICD-10-CM | POA: Insufficient documentation

## 2021-08-23 DIAGNOSIS — Z86010 Personal history of colonic polyps: Secondary | ICD-10-CM

## 2021-08-23 DIAGNOSIS — Z8371 Family history of colonic polyps: Secondary | ICD-10-CM | POA: Insufficient documentation

## 2021-08-23 DIAGNOSIS — N189 Chronic kidney disease, unspecified: Secondary | ICD-10-CM | POA: Insufficient documentation

## 2021-08-23 DIAGNOSIS — Z7984 Long term (current) use of oral hypoglycemic drugs: Secondary | ICD-10-CM | POA: Insufficient documentation

## 2021-08-23 DIAGNOSIS — E1122 Type 2 diabetes mellitus with diabetic chronic kidney disease: Secondary | ICD-10-CM | POA: Insufficient documentation

## 2021-08-23 DIAGNOSIS — I129 Hypertensive chronic kidney disease with stage 1 through stage 4 chronic kidney disease, or unspecified chronic kidney disease: Secondary | ICD-10-CM | POA: Insufficient documentation

## 2021-08-23 DIAGNOSIS — G4730 Sleep apnea, unspecified: Secondary | ICD-10-CM | POA: Insufficient documentation

## 2021-08-23 DIAGNOSIS — Q438 Other specified congenital malformations of intestine: Secondary | ICD-10-CM | POA: Insufficient documentation

## 2021-08-23 DIAGNOSIS — K219 Gastro-esophageal reflux disease without esophagitis: Secondary | ICD-10-CM | POA: Insufficient documentation

## 2021-08-23 DIAGNOSIS — K5730 Diverticulosis of large intestine without perforation or abscess without bleeding: Secondary | ICD-10-CM | POA: Insufficient documentation

## 2021-08-23 HISTORY — PX: COLONOSCOPY WITH PROPOFOL: SHX5780

## 2021-08-23 LAB — GLUCOSE, CAPILLARY
Glucose-Capillary: 88 mg/dL (ref 70–99)
Glucose-Capillary: 123 mg/dL — ABNORMAL HIGH (ref 70–99)
Glucose-Capillary: 108 mg/dL — ABNORMAL HIGH (ref 70–99)

## 2021-08-23 SURGERY — COLONOSCOPY WITH PROPOFOL
Anesthesia: General

## 2021-08-23 MED ORDER — DEXTROSE 50 % IV SOLN
INTRAVENOUS | Status: AC
  Administered 2021-08-23: 25 mL via INTRAVENOUS
  Filled 2021-08-23: qty 50

## 2021-08-23 MED ORDER — LIDOCAINE HCL (CARDIAC) PF 100 MG/5ML IV SOSY
PREFILLED_SYRINGE | INTRAVENOUS | Status: DC | PRN
  Administered 2021-08-23: 50 mg via INTRAVENOUS

## 2021-08-23 MED ORDER — LACTATED RINGERS IV SOLN: INTRAVENOUS | Status: DC

## 2021-08-23 MED ORDER — PROPOFOL 500 MG/50ML IV EMUL
INTRAVENOUS | Status: DC | PRN
  Administered 2021-08-23: 150 ug/kg/min via INTRAVENOUS

## 2021-08-23 MED ORDER — PROPOFOL 10 MG/ML IV BOLUS
INTRAVENOUS | Status: DC | PRN
  Administered 2021-08-23: 100 mg via INTRAVENOUS

## 2021-08-23 MED ORDER — DEXTROSE 50 % IV SOLN: 25.0000 mL | Freq: Once | INTRAVENOUS | Status: AC

## 2021-08-23 NOTE — H&P (Signed)
'@LOGO' @   Primary Care Physician:  Wendi Maya, NP Primary Gastroenterologist:  Dr. Gala Romney  Pre-Procedure History & Physical: HPI:  Ashley Hickman is a 61 y.o. female here for Screening colonoscopy.  Sister with multiple colonic polyps requiring segmental resection of her colon.  Patient has no bowel symptoms at this time.  Previous history of paper hematochezia though symptoms resolved months ago.  Attempted colonoscopy earlier in the year thwarted by poor prep.  Patient states that she did better with her prep this go around.  Past Medical History:  Diagnosis Date   CKD (chronic kidney disease)    Depression    GERD (gastroesophageal reflux disease)    Hypertension    Migraine    PONV (postoperative nausea and vomiting)    Sleep apnea    T2DM (type 2 diabetes mellitus) ([place])     Past Surgical History:  Procedure Laterality Date   ABDOMINAL HYSTERECTOMY     BIOPSY  05/28/2021   Procedure: BIOPSY;  Surgeon: Daneil Dolin, MD;  [facility]: [facility];  Service: Endoscopy;;   CESAREAN SECTION     COLONOSCOPY WITH PROPOFOL N/A 05/28/2021   Procedure: COLONOSCOPY WITH PROPOFOL;  Surgeon: Daneil Dolin, MD;  Location: [facility];  Service: Endoscopy;  Laterality: N/A;  7:30am   ESOPHAGOGASTRODUODENOSCOPY (EGD) WITH PROPOFOL N/A 05/28/2021   Procedure: ESOPHAGOGASTRODUODENOSCOPY (EGD) WITH PROPOFOL;  Surgeon: Daneil Dolin, MD;  Location: [facility];  Service: Endoscopy;  Laterality: N/A;   MALONEY DILATION N/A 05/28/2021   Procedure: Venia Minks DILATION;  Surgeon: Daneil Dolin, MD;  Location: [facility];  Service: Endoscopy;  Laterality: N/A;   PLACEMENT OF BREAST IMPLANTS     SHOULDER SURGERY Right    TONSILLECTOMY      Prior to Admission medications   Medication Sig Start Date End Date Taking? Authorizing Provider  acetaminophen (TYLENOL) 650 MG CR tablet Take 1,300 mg by mouth every 8 (eight) hours as needed for pain.   Yes [provider]   ARIPiprazole (ABILIFY) 15 MG tablet Take 15 mg by mouth daily.   Yes [provider]  Atogepant (QULIPTA) 60 MG TABS Take 60 mg by mouth daily.   Yes [provider]  B Complex-C (SUPER B-COMPLEX + VITAMIN C) TABS Take 1 tablet by mouth daily.   Yes [provider]  Biotin 2500 MCG CHEW Chew 5,000 mcg by mouth daily.   Yes [provider]  Calcium Citrate-Vitamin D 315-5 MG-MCG TABS Take 2 tablets by mouth daily.   Yes [provider]  Cholecalciferol (VITAMIN D3) 25 MCG (1000 UT) CAPS Take 1,000 Units by mouth daily.   Yes [provider]  eszopiclone (LUNESTA) 2 MG TABS tablet Take 2 mg by mouth at bedtime.   Yes [provider]  Flaxseed, Linseed, (FLAXSEED OIL) 1200 MG CAPS Take 1,200 mg by mouth daily.   Yes [provider]  folic acid (FOLVITE) 132 MCG tablet Take 800 mcg by mouth at bedtime.   Yes [provider]  HYDROcodone-acetaminophen (NORCO) 10-325 MG tablet Take 1 tablet by mouth every 6 (six) hours as needed for severe pain. 03/15/21  Yes [provider]  JANUVIA 50 MG tablet Take 50 mg by mouth daily. 03/23/21  Yes [provider]  lisinopril (ZESTRIL) 20 MG tablet Take 20 mg by mouth in the morning and at bedtime. 03/30/21  Yes [provider]  memantine (NAMENDA) 10 MG tablet Take 10 mg by mouth daily.  Yes [provider]  methocarbamol (ROBAXIN) 500 MG tablet Take 500 mg by mouth 3 (three) times daily as needed for muscle spasms. 12/04/20  Yes [provider]  nitrofurantoin (MACRODANTIN) 100 MG capsule Take 100 mg by mouth at bedtime as needed (UTI).   Yes [provider]  omeprazole (PRILOSEC) 40 MG capsule Take 40 mg by mouth daily.   Yes [provider]  Probiotic Product (PROBIOTIC DAILY PO) Take 1 capsule by mouth daily.   Yes [provider]  propranolol (INDERAL) 10 MG tablet Take 20 mg by mouth 2 (two) times daily.  03/29/21  Yes [provider]  rosuvastatin (CRESTOR) 20 MG tablet Take 20 mg by mouth at bedtime. 03/29/21  Yes [provider]  tolterodine (DETROL LA) 4 MG 24 hr capsule Take 4 mg by mouth daily. 03/30/21  Yes [provider]  TURMERIC PO Take 2 each by mouth daily. With Ginger   Yes [provider]  Venlafaxine HCl 225 MG TB24 Take 225 mg by mouth daily.   Yes [provider]  Embrace Lancets Ultra Thin 30G MISC USE AS DIRECTED ONCE DAILY testing AT varying TIMES (fasting AND 2 HOURS AFTER largest MEAL) 02/21/21   [provider]  glucose blood (EASYMAX TEST) test strip USE AS DIRECTED testing ONCE A DAY varying TIMES (fasting AND 2 HOURS AFTER largest MEAL) 01/24/20   [provider]  Sod Picosulfate-Mag Ox-Cit Acd (CLENPIQ) 10-3.5-12 MG-GM -GM/175ML SOLN Take 1 kit by mouth as directed. 06/29/21   Ashley Hickman, Cristopher Estimable, MD  Ubrogepant (UBRELVY) 100 MG TABS Take 100 mg by mouth daily as needed (migraine).    [provider]    Allergies as of 06/29/2021 - Review Complete 06/26/2021  Allergen Reaction Noted   Sulfa antibiotics  12/10/2015   Penicillins Rash 12/10/2015    Family History  Problem Relation Age of Onset   Colon polyps Sister        pre-cancerous, numerous, required partial colectomy    Social History   Socioeconomic History   Marital status: Married    Spouse name: Not on file   Number of children: Not on file   Years of education: Not on file   Highest education level: Not on file  Occupational History   Not on file  Tobacco Use   Smoking status: Never   Smokeless tobacco: Never  Vaping Use   Vaping Use: Never used  Substance and Sexual Activity   Alcohol use: Yes    Comment: occasionally   Drug use: No   Sexual activity: Yes  Other Topics Concern   Not on file  Social History Narrative   Not on file   Social Determinants of Health   Financial Resource Strain: Not on file  Food  Insecurity: Not on file  Transportation Needs: Not on file  Physical Activity: Not on file  Stress: Not on file  Social Connections: Not on file  Intimate Partner Violence: Not on file    Review of Systems: See HPI, otherwise negative ROS  Physical Exam: BP (!) 152/70   Pulse 60   Temp 98.3 F (36.8 C) (Oral)   Resp 18   SpO2 97%  General:   Alert,  Well-developed, well-nourished, pleasant and cooperative in NAD ns. Mouth:  No deformity or lesions. Neck:  Supple; no masses or thyromegaly. No significant cervical adenopathy. Lungs:  Clear throughout to auscultation.   No wheezes, crackles, or rhonchi. No acute distress. Heart:  Regular rate  and rhythm; no murmurs, clicks, rubs,  or gallops. Abdomen: Non-distended, normal bowel sounds.  Soft and nontender without appreciable mass or hepatosplenomegaly.  Pulses:  Normal pulses noted. Extremities:  Without clubbing or edema.  Impression/Plan:    61 year old lady here for high risk screening colonoscopy The risks, benefits, limitations, alternatives and imponderables have been reviewed with the patient. Questions have been answered. All parties are agreeable.       Notice: This dictation was prepared with Dragon dictation along with smaller phrase technology. Any transcriptional errors that result from this process are unintentional and may not be corrected upon review.

## 2021-08-23 NOTE — Anesthesia Preprocedure Evaluation (Signed)
Anesthesia Evaluation  Patient identified by MRN, date of birth, ID band Patient awake    Reviewed: Allergy & Precautions, NPO status , Patient's Chart, lab work & pertinent test results  History of Anesthesia Complications (+) PONV and history of anesthetic complications  Airway Mallampati: III  TM Distance: <3 FB Neck ROM: Full  Mouth opening: Limited Mouth Opening  Dental  (+) Teeth Intact, Dental Advisory Given   Pulmonary sleep apnea ,    Pulmonary exam normal breath sounds clear to auscultation       Cardiovascular Exercise Tolerance: Good hypertension, Pt. on medications Normal cardiovascular exam Rhythm:Regular Rate:Normal     Neuro/Psych  Headaches, PSYCHIATRIC DISORDERS Depression    GI/Hepatic Neg liver ROS, GERD  Medicated and Controlled,  Endo/Other  diabetes, Well Controlled, Type 2, Oral Hypoglycemic Agents  Renal/GU Renal disease  negative genitourinary   Musculoskeletal negative musculoskeletal ROS (+)   Abdominal   Peds negative pediatric ROS (+)  Hematology negative hematology ROS (+)   Anesthesia Other Findings   Reproductive/Obstetrics negative OB ROS                             Anesthesia Physical Anesthesia Plan  ASA: 2  Anesthesia Plan: General   Post-op Pain Management: Minimal or no pain anticipated   Induction: Intravenous  PONV Risk Score and Plan: Propofol infusion  Airway Management Planned: Nasal Cannula and Natural Airway  Additional Equipment:   Intra-op Plan:   Post-operative Plan:   Informed Consent: I have reviewed the patients History and Physical, chart, labs and discussed the procedure including the risks, benefits and alternatives for the proposed anesthesia with the patient or authorized representative who has indicated his/her understanding and acceptance.     Dental advisory given  Plan Discussed with: CRNA and  Surgeon  Anesthesia Plan Comments:         Anesthesia Quick Evaluation

## 2021-08-23 NOTE — Discharge Instructions (Signed)
  Colonoscopy Discharge Instructions  Read the instructions outlined below and refer to this sheet in the next few weeks. These discharge instructions provide you with general information on caring for yourself after you leave the hospital. Your doctor may also give you specific instructions. While your treatment has been planned according to the most current medical practices available, unavoidable complications occasionally occur. If you have any problems or questions after discharge, call Dr. Jena Gauss at [phone]. ACTIVITY You may resume your regular activity, but move at a slower pace for the next 24 hours.  Take frequent rest periods for the next 24 hours.  Walking will help get rid of the air and reduce the bloated feeling in your belly (abdomen).  No driving for 24 hours (because of the medicine (anesthesia) used during the test).   Do not sign any important legal documents or operate any machinery for 24 hours (because of the anesthesia used during the test).  NUTRITION Drink plenty of fluids.  You may resume your normal diet as instructed by your doctor.  Begin with a light meal and progress to your normal diet. Heavy or fried foods are harder to digest and may make you feel sick to your stomach (nauseated).  Avoid alcoholic beverages for 24 hours or as instructed.  MEDICATIONS You may resume your normal medications unless your doctor tells you otherwise.  WHAT YOU CAN EXPECT TODAY Some feelings of bloating in the abdomen.  Passage of more gas than usual.  Spotting of blood in your stool or on the toilet paper.  IF YOU HAD POLYPS REMOVED DURING THE COLONOSCOPY: No aspirin products for 7 days or as instructed.  No alcohol for 7 days or as instructed.  Eat a soft diet for the next 24 hours.  FINDING OUT THE RESULTS OF YOUR TEST Not all test results are available during your visit. If your test results are not back during the visit, make an appointment with your caregiver to find out the  results. Do not assume everything is normal if you have not heard from your caregiver or the medical facility. It is important for you to follow up on all of your test results.  SEEK IMMEDIATE MEDICAL ATTENTION IF: You have more than a spotting of blood in your stool.  Your belly is swollen (abdominal distention).  You are nauseated or vomiting.  You have a temperature over 101.  You have abdominal pain or discomfort that is severe or gets worse throughout the day.       Prep much better today   no polyps.    Diverticulosis information provided   recommend repeat colonoscopy in 5 years   At patient request, I called Rosaleigh Brazzel at [phone] -  reviewed findings and recommendations

## 2021-08-23 NOTE — Transfer of Care (Signed)
Immediate Anesthesia Transfer of Care Note  Patient: Ashley Hickman  Procedure(s) Performed: COLONOSCOPY WITH PROPOFOL  Patient Location: Short Stay  Anesthesia Type:General  Level of Consciousness: awake  Airway & Oxygen Therapy: Patient Spontanous Breathing  Post-op Assessment: Report given to RN and Post -op Vital signs reviewed and stable  Post vital signs: Reviewed and stable  Last Vitals:  Vitals Value Taken Time  BP    Temp    Pulse    Resp    SpO2      Last Pain:  Vitals:   08/23/21 1228  TempSrc:   PainSc: 0-No pain      Patients Stated Pain Goal: 6 (08/23/21 1108)  Complications: No notable events documented.

## 2021-08-23 NOTE — Op Note (Signed)
[facility] Patient Name: Ashley Hickman Procedure Date: 08/23/2021 11:38 AM MRN: [id number] Date of Birth: [birth year] Attending MD: Norvel Richards , MD CSN: [id number] Age: 61 Admit Type: Outpatient Procedure:                Colonoscopy Indications:              Screening for colorectal malignant neoplasm; family                            history of colon polyps Providers:                Norvel Richards, MD, Lambert Mody, Caprice Kluver, Kristine L. Risa Grill, Technician, Everardo Pacific Referring MD:              Medicines:                Propofol per Anesthesia Complications:            No immediate complications. Estimated Blood Loss:     Estimated blood loss: none. Procedure:                Pre-Anesthesia Assessment:                           - Prior to the procedure, a History and Physical                            was performed, and patient medications and                            allergies were reviewed. The patient's tolerance of                            previous anesthesia was also reviewed. The risks                            and benefits of the procedure and the sedation                            options and risks were discussed with the patient.                            All questions were answered, and informed consent                            was obtained. Prior Anticoagulants: The patient has                            taken no previous anticoagulant or antiplatelet                            agents. ASA Grade  Assessment: III - A patient with                            severe systemic disease. After reviewing the risks                            and benefits, the patient was deemed in                            satisfactory condition to undergo the procedure.                           After obtaining informed consent, the colonoscope                            was passed under direct  vision. Throughout the                            procedure, the patient's blood pressure, pulse, and                            oxygen saturations were monitored continuously. The                            [id number] scope was introduced through the                            anus and advanced to the the cecum, identified by                            appendiceal orifice and ileocecal valve. The                            colonoscopy was performed without difficulty. The                            patient tolerated the procedure well. The quality                            of the bowel preparation was adequate. Scope In: 12:32:24 PM Scope Out: 12:55:02 PM Scope Withdrawal Time: 0 hours 6 minutes 21 seconds  Total Procedure Duration: 0 hours 22 minutes 38 seconds  Findings:      The perianal and digital rectal examinations were normal.      A few small-mouthed diverticula were found in the entire colon.       Redundant colon. External abdominal pressure required to reach the cecum.      The exam was otherwise without abnormality on direct and retroflexion       views. Impression:               - Diverticulosis in the entire examined colon.                           - The examination was otherwise normal on direct  and retroflexion views. Redundant colon.                           - No specimens collected. Moderate Sedation:      Moderate (conscious) sedation was personally administered by an       anesthesia professional. The following parameters were monitored: oxygen       saturation, heart rate, blood pressure, respiratory rate, EKG, adequacy       of pulmonary ventilation, and response to care. Recommendation:           - Patient has a contact number available for                            emergencies. The signs and symptoms of potential                            delayed complications were discussed with the                            patient.  Return to normal activities tomorrow.                            Written discharge instructions were provided to the                            patient.                           - Resume previous diet.                           - Continue present medications.                           - Repeat colonoscopy in 5 years for screening                            purposes.                           - Return to GI office (date not yet determined). Procedure Code(s):        --- Professional ---                           [phone], Colonoscopy, flexible; diagnostic, including                            collection of specimen(s) by brushing or washing,                            when performed (separate procedure) Diagnosis Code(s):        --- Professional ---                           Z12.11, Encounter for screening for malignant  neoplasm of colon                           K57.30, Diverticulosis of large intestine without                            perforation or abscess without bleeding CPT copyright 2019 American Medical Association. All rights reserved. The codes documented in this report are preliminary and upon coder review may  be revised to meet current compliance requirements. Gerrit Friends. Rosselyn Martha, MD Gennette Pac, MD 08/23/2021 1:03:48 PM This report has been signed electronically. Number of Addenda: 0

## 2021-08-23 NOTE — Anesthesia Postprocedure Evaluation (Signed)
Anesthesia Post Note  Patient: Ashley Hickman  Procedure(s) Performed: COLONOSCOPY WITH PROPOFOL  Patient location during evaluation: Phase II Anesthesia Type: General Level of consciousness: awake and alert and oriented Pain management: pain level controlled Vital Signs Assessment: post-procedure vital signs reviewed and stable Respiratory status: spontaneous breathing, nonlabored ventilation and respiratory function stable Cardiovascular status: blood pressure returned to baseline and stable Postop Assessment: no apparent nausea or vomiting Anesthetic complications: no   No notable events documented.   Last Vitals:  Vitals:   08/23/21 1108 08/23/21 1258  BP: (!) 152/70 (!) 108/58  Pulse: 60 77  Resp: 18 18  Temp: 36.8 C 36.5 C  SpO2: 97% 98%    Last Pain:  Vitals:   08/23/21 1258  TempSrc: Oral  PainSc: 0-No pain                 Kelsha Older C Aamir Mclinden

## 2021-08-29 ENCOUNTER — Encounter (HOSPITAL_COMMUNITY): Payer: Self-pay | Admitting: Internal Medicine

## 2022-05-14 ENCOUNTER — Encounter: Payer: BC Managed Care – PPO | Admitting: Internal Medicine

## 2022-05-28 ENCOUNTER — Encounter: Payer: Self-pay | Admitting: Internal Medicine

## 2022-05-28 ENCOUNTER — Telehealth: Payer: Self-pay | Admitting: *Deleted

## 2022-05-28 ENCOUNTER — Ambulatory Visit: Payer: BC Managed Care – PPO | Admitting: Internal Medicine

## 2022-05-28 ENCOUNTER — Other Ambulatory Visit: Payer: Self-pay | Admitting: *Deleted

## 2022-05-28 VITALS — BP 97/67 | HR 80 | Temp 98.2°F | Ht 60.0 in | Wt 139.6 lb

## 2022-05-28 DIAGNOSIS — R1310 Dysphagia, unspecified: Secondary | ICD-10-CM

## 2022-05-28 DIAGNOSIS — K219 Gastro-esophageal reflux disease without esophagitis: Secondary | ICD-10-CM | POA: Diagnosis not present

## 2022-05-28 NOTE — Telephone Encounter (Signed)
[facility]  BPE scheduled for 06/05/22, arrive at 9 am at [facility]. NPO 3 hours prior

## 2022-05-28 NOTE — Patient Instructions (Signed)
It was good to see you again today!  Continue omeprazole 40 mg every day  As discussed, we will obtain a barium pill esophagram to further evaluate your dysphagia symptoms  Further recommendations to follow

## 2022-05-28 NOTE — Telephone Encounter (Signed)
Pt informed of procedure date and time.

## 2022-05-30 NOTE — Progress Notes (Signed)
Primary Care Physician:  Janean Sark, NP Primary Gastroenterologist:  Dr. Jena Gauss  Pre-Procedure History & Physical: HPI:  Ashley Hickman is a 62 y.o. female here for follow-up GERD and esophageal dysphagia.  Patient underwent empiric dilation of a normal-appearing esophagus last year.  Biopsies negative for eosinophilic esophagitis.  Tubular esophagus appeared widely patent and normal.  States dilation did not help she is continues to have sensation that things "catch behind her breastbone" reflux well-controlled on omeprazole.  Negative high risk screening colonoscopy last year; due for repeat examination in 4 years.  Past Medical History:  Diagnosis Date   CKD (chronic kidney disease)    Depression    GERD (gastroesophageal reflux disease)    Hypertension    Migraine    PONV (postoperative nausea and vomiting)    Sleep apnea    T2DM (type 2 diabetes mellitus) (HCC)     Past Surgical History:  Procedure Laterality Date   ABDOMINAL HYSTERECTOMY     BIOPSY  05/28/2021   Procedure: BIOPSY;  Surgeon: Corbin Ade, MD;  [facility];  Service: Endoscopy;;   CESAREAN SECTION     COLONOSCOPY WITH PROPOFOL N/A 05/28/2021   Procedure: COLONOSCOPY WITH PROPOFOL;  Surgeon: Corbin Ade, MD;  [facility];  Service: Endoscopy;  Laterality: N/A;  7:30am   COLONOSCOPY WITH PROPOFOL N/A 08/23/2021   Procedure: COLONOSCOPY WITH PROPOFOL;  Surgeon: Corbin Ade, MD;  [facility];  Service: Endoscopy;  Laterality: N/A;  1:00pm   ESOPHAGOGASTRODUODENOSCOPY (EGD) WITH PROPOFOL N/A 05/28/2021   Procedure: ESOPHAGOGASTRODUODENOSCOPY (EGD) WITH PROPOFOL;  Surgeon: Corbin Ade, MD;  [facility];  Service: Endoscopy;  Laterality: N/A;   MALONEY DILATION N/A 05/28/2021   Procedure: Elease Hashimoto DILATION;  Surgeon: Corbin Ade, MD;  [facility];  Service: Endoscopy;  Laterality: N/A;   PLACEMENT OF BREAST IMPLANTS     SHOULDER  SURGERY Right    TONSILLECTOMY      Prior to Admission medications   Medication Sig Start Date End Date Taking? Authorizing Provider  acetaminophen (TYLENOL) 650 MG CR tablet Take 1,300 mg by mouth every 8 (eight) hours as needed for pain.   Yes [provider]  ARIPiprazole (ABILIFY) 15 MG tablet Take 15 mg by mouth daily.   Yes [provider]  Atogepant (QULIPTA) 60 MG TABS Take 60 mg by mouth daily.   Yes [provider]  Biotin 2500 MCG CHEW Chew 5,000 mcg by mouth daily.   Yes [provider]  Calcium Citrate-Vitamin D 315-5 MG-MCG TABS Take 2 tablets by mouth daily.   Yes [provider]  Cholecalciferol (VITAMIN D3) 25 MCG (1000 UT) CAPS Take 1,000 Units by mouth daily.   Yes [provider]  Embrace Lancets Ultra Thin 30G MISC USE AS DIRECTED ONCE DAILY testing AT varying TIMES (fasting AND 2 HOURS AFTER largest MEAL) 02/21/21  Yes [provider]  eszopiclone (LUNESTA) 2 MG TABS tablet Take 2 mg by mouth at bedtime.   Yes [provider]  FARXIGA 5 MG TABS tablet Take 5 mg by mouth daily.   Yes [provider]  Flaxseed, Linseed, (FLAXSEED OIL) 1200 MG CAPS Take 1,200 mg by mouth daily.   Yes [provider]  folic acid (FOLVITE) 800 MCG tablet Take 800 mcg by mouth at bedtime.   Yes [provider]  glucose blood (EASYMAX TEST) test strip USE AS DIRECTED testing ONCE A  DAY varying TIMES (fasting AND 2 HOURS AFTER largest MEAL) 01/24/20  Yes [provider]  HYDROcodone-acetaminophen (NORCO) 10-325 MG tablet Take 1 tablet by mouth every 6 (six) hours as needed for severe pain. 03/15/21  Yes [provider]  JANUVIA 50 MG tablet Take 50 mg by mouth daily. 03/23/21  Yes [provider]  lamoTRIgine (LAMICTAL) 25 MG tablet Take 75 mg by mouth daily.   Yes [provider]  lisinopril (ZESTRIL) 20 MG tablet Take 20 mg by mouth in the morning and at bedtime.  03/30/21  Yes [provider]  memantine (NAMENDA) 10 MG tablet Take 10 mg by mouth daily.   Yes [provider]  methocarbamol (ROBAXIN) 500 MG tablet Take 500 mg by mouth 3 (three) times daily as needed for muscle spasms. 12/04/20  Yes [provider]  nitrofurantoin (MACRODANTIN) 100 MG capsule Take 100 mg by mouth at bedtime as needed (UTI).   Yes [provider]  omeprazole (PRILOSEC) 40 MG capsule Take 40 mg by mouth daily.   Yes [provider]  Probiotic Product (PROBIOTIC DAILY PO) Take 1 capsule by mouth daily.   Yes [provider]  propranolol (INDERAL) 10 MG tablet Take 20 mg by mouth 2 (two) times daily. 03/29/21  Yes [provider]  rosuvastatin (CRESTOR) 20 MG tablet Take 20 mg by mouth at bedtime. 03/29/21  Yes [provider]  tolterodine (DETROL LA) 4 MG 24 hr capsule Take 4 mg by mouth daily. 03/30/21  Yes [provider]  TURMERIC PO Take 2 each by mouth daily. With Ginger   Yes [provider]  Ubrogepant (UBRELVY) 100 MG TABS Take 100 mg by mouth daily as needed (migraine).   Yes [provider]  Venlafaxine HCl 225 MG TB24 Take 225 mg by mouth daily.   Yes [provider]  vitamin B-12 (CYANOCOBALAMIN) 100 MCG tablet Take 100 mcg by mouth daily.   Yes [provider]    Allergies as of 05/28/2022 - Review Complete 05/28/2022  Allergen Reaction Noted   Lifitegrast Other (See Comments) 05/28/2022   Other Other (See Comments) 03/07/2020   Sulfa antibiotics  12/10/2015   Penicillins Rash 12/10/2015    Family History  Problem Relation Age of Onset   Colon polyps Sister        pre-cancerous, numerous, required partial colectomy    Social History   Socioeconomic History   Marital status: Married    Spouse name: Not on file   Number of children: Not on file   Years of education: Not on file   Highest education level: Not on file  Occupational History    Not on file  Tobacco Use   Smoking status: Never   Smokeless tobacco: Never  Vaping Use   Vaping Use: Never used  Substance and Sexual Activity   Alcohol use: Yes    Comment: occasionally   Drug use: No   Sexual activity: Yes  Other Topics Concern   Not on file  Social History Narrative   Not on file   Social Determinants of Health   Financial Resource Strain: Not on file  Food Insecurity: Not on file  Transportation Needs: Not on file  Physical Activity: Not on file  Stress: Not on file  Social Connections: Not on file  Intimate Partner Violence: Not on file    Review of Systems: See HPI, otherwise negative ROS  Physical Exam: BP 97/67 (BP Location: Right Arm, Patient Position: Sitting,  Cuff Size: Normal)   Pulse 80   Temp 98.2 F (36.8 C) (Oral)   Ht 5' (1.524 m)   Wt 139 lb 9.6 oz (63.3 kg)   SpO2 94%   BMI 27.26 kg/m  General:   Alert,  Well-developed, well-nourished, pleasant and cooperative in NAD Abdomen: Non-distended, normal bowel sounds.  Soft and nontender without appreciable mass or hepatosplenomegaly.    Impression/Plan: 62 year old lady with longstanding GERD well-controlled on once daily PPI.  Esophageal dysphagia now for couple of years.  Normal esophagus last year and no response to empiric large bore [facility] dilation.  Symptoms persisting.  Need to consider globus and an underlying esophageal motility disorder.  Less likely she has an occult narrowing in her esophagus which was not effectively dilated previously.  Recommendations:  Continue omeprazole 40 mg every day  As discussed, we will obtain a barium pill esophagram to further evaluate dysphagia symptoms  Plan for repeat screening colonoscopy in 4 years  Further recommendations to follow    Notice: This dictation was prepared with Dragon dictation along with smaller phrase technology. Any transcriptional errors that result from this process are unintentional and may not be corrected  upon review.

## 2022-06-05 ENCOUNTER — Ambulatory Visit (HOSPITAL_COMMUNITY)
Admission: RE | Admit: 2022-06-05 | Discharge: 2022-06-05 | Disposition: A | Discharge status: Home / Self Care | Payer: BC Managed Care – PPO | Source: Ambulatory Visit | Attending: Internal Medicine | Admitting: Internal Medicine

## 2022-06-05 DIAGNOSIS — R1310 Dysphagia, unspecified: Secondary | ICD-10-CM | POA: Diagnosis present

## 2022-08-28 ENCOUNTER — Encounter: Payer: Self-pay | Admitting: Internal Medicine

## 2022-10-15 ENCOUNTER — Other Ambulatory Visit: Payer: Self-pay | Admitting: *Deleted

## 2022-10-15 ENCOUNTER — Encounter: Payer: Self-pay | Admitting: Internal Medicine

## 2022-10-15 ENCOUNTER — Ambulatory Visit (INDEPENDENT_AMBULATORY_CARE_PROVIDER_SITE_OTHER): Payer: BC Managed Care – PPO | Admitting: Internal Medicine

## 2022-10-15 VITALS — BP 142/84 | HR 71 | Temp 98.6°F | Ht 60.0 in | Wt 144.6 lb

## 2022-10-15 DIAGNOSIS — K219 Gastro-esophageal reflux disease without esophagitis: Secondary | ICD-10-CM

## 2022-10-15 DIAGNOSIS — K5909 Other constipation: Secondary | ICD-10-CM

## 2022-10-15 DIAGNOSIS — R1310 Dysphagia, unspecified: Secondary | ICD-10-CM | POA: Diagnosis not present

## 2022-10-15 NOTE — Patient Instructions (Signed)
It was good to see you again today!  As discussed the upper endoscopy and the barium examination did not show a blockage in your esophagus.  Is possible the muscles in your esophagus or not coordinating  Therefore, we will order an esophageal manometry to further evaluate.  Sounds as though your GERD symptoms are well-controlled.  Continue taking omeprazole daily-best taken 30 minutes before meal  Continue MiraLAX as needed for constipation  Plan for repeat colonoscopy in 4 years  Further recommendations to follow once manometry results are available for review.

## 2022-10-15 NOTE — Progress Notes (Unsigned)
Primary Care Physician:  Janean Sark, NP Primary Gastroenterologist:  Dr. Jena Gauss  Pre-Procedure History & Physical: HPI:  Ashley Hickman is a 62 y.o. female here for further evaluation of ongoing esophageal dysphagia.  Describes dysphagia ongoing for upwards of 3 years.  Workup thus far includes EGD which revealed a normal-appearing tubular esophagus; 54 French Maloney dilator passed empirically.  No improvement.  Barium pill esophagram demonstrated no impediment to transit of the barium or the feel some dysmotility was noted.  Patient notes with meats and some of her pills she has a difficult time she can initiate a swallow but feels things hanging just below her suprasternal notch.  Taking her medications with applesauce and cutting her food up into fine pieces does allow her to swallow better.  She does take hydrocodone each morning for chronic back pain.  Does not really describe oropharyngeal dysfunction.  GERD reportedly under excellent control taking omeprazole 40 mg at bedtime  Constipation managed well with MiraLAX.  Diverticulosis redundant colon on 2023 colonoscopy; due for high risk screening examination for years-positive family history of colon cancer.  Past Medical History:  Diagnosis Date   CKD (chronic kidney disease)    Depression    GERD (gastroesophageal reflux disease)    Hypertension    Migraine    PONV (postoperative nausea and vomiting)    Sleep apnea    T2DM (type 2 diabetes mellitus) (HCC)     Past Surgical History:  Procedure Laterality Date   ABDOMINAL HYSTERECTOMY     BIOPSY  05/28/2021   Procedure: BIOPSY;  Surgeon: Corbin Ade, MD;  [facility];  Service: Endoscopy;;   CESAREAN SECTION     COLONOSCOPY WITH PROPOFOL N/A 05/28/2021   Procedure: COLONOSCOPY WITH PROPOFOL;  Surgeon: Corbin Ade, MD;  [facility];  Service: Endoscopy;  Laterality: N/A;  7:30am   COLONOSCOPY WITH PROPOFOL N/A 08/23/2021   Procedure:  COLONOSCOPY WITH PROPOFOL;  Surgeon: Corbin Ade, MD;  [facility];  Service: Endoscopy;  Laterality: N/A;  1:00pm   ESOPHAGOGASTRODUODENOSCOPY (EGD) WITH PROPOFOL N/A 05/28/2021   Procedure: ESOPHAGOGASTRODUODENOSCOPY (EGD) WITH PROPOFOL;  Surgeon: Corbin Ade, MD;  [facility];  Service: Endoscopy;  Laterality: N/A;   MALONEY DILATION N/A 05/28/2021   Procedure: Elease Hashimoto DILATION;  Surgeon: Corbin Ade, MD;  [facility];  Service: Endoscopy;  Laterality: N/A;   PLACEMENT OF BREAST IMPLANTS     SHOULDER SURGERY Right    TONSILLECTOMY      Prior to Admission medications   Medication Sig Start Date End Date Taking? Authorizing Provider  acetaminophen (TYLENOL) 650 MG CR tablet Take 1,300 mg by mouth every 8 (eight) hours as needed for pain.   Yes [provider]  ARIPiprazole (ABILIFY) 15 MG tablet Take 15 mg by mouth daily.   Yes [provider]  Atogepant (QULIPTA) 60 MG TABS Take 60 mg by mouth daily.   Yes [provider]  Biotin 2500 MCG CHEW Chew 5,000 mcg by mouth daily.   Yes [provider]  Calcium Citrate-Vitamin D 315-5 MG-MCG TABS Take 2 tablets by mouth daily.   Yes [provider]  Cholecalciferol (VITAMIN D3) 25 MCG (1000 UT) CAPS Take 1,000 Units by mouth daily.   Yes [provider]  Embrace Lancets Ultra Thin 30G MISC USE AS DIRECTED ONCE DAILY testing AT varying TIMES (fasting AND 2 HOURS AFTER largest MEAL) 02/21/21  Yes [provider]  eszopiclone (LUNESTA) 2 MG TABS tablet Take 2 mg by mouth at bedtime.   Yes [provider]  Flaxseed, Linseed, (FLAXSEED OIL) 1200 MG CAPS Take 1,200 mg by mouth daily.   Yes [provider]  folic acid (FOLVITE) 800 MCG tablet Take 800 mcg by mouth at bedtime.   Yes [provider]  glucose blood (EASYMAX TEST) test strip USE AS DIRECTED testing ONCE A DAY varying TIMES (fasting AND 2 HOURS AFTER largest  MEAL) 01/24/20  Yes [provider]  HYDROcodone-acetaminophen (NORCO) 10-325 MG tablet Take 1 tablet by mouth every 6 (six) hours as needed for severe pain. 03/15/21  Yes [provider]  JANUVIA 50 MG tablet Take 50 mg by mouth daily. 03/23/21  Yes [provider]  lamoTRIgine (LAMICTAL) 200 MG tablet 1 tablet orally once daily for 30 days   Yes [provider]  lisinopril (ZESTRIL) 20 MG tablet Take 20 mg by mouth in the morning and at bedtime. 03/30/21  Yes [provider]  memantine (NAMENDA) 10 MG tablet Take 10 mg by mouth daily.   Yes [provider]  methocarbamol (ROBAXIN) 500 MG tablet Take 500 mg by mouth 3 (three) times daily as needed for muscle spasms. 12/04/20  Yes [provider]  nitrofurantoin (MACRODANTIN) 100 MG capsule Take 100 mg by mouth at bedtime as needed (UTI).   Yes [provider]  omeprazole (PRILOSEC) 40 MG capsule Take 40 mg by mouth daily.   Yes [provider]  Probiotic Product (PROBIOTIC DAILY PO) Take 1 capsule by mouth daily.   Yes [provider]  propranolol (INDERAL) 10 MG tablet Take 20 mg by mouth 2 (two) times daily. 03/29/21  Yes [provider]  rosuvastatin (CRESTOR) 20 MG tablet Take 20 mg by mouth at bedtime. 03/29/21  Yes [provider]  tolterodine (DETROL LA) 4 MG 24 hr capsule Take 4 mg by mouth daily. 03/30/21  Yes [provider]  TURMERIC PO Take 2 each by mouth daily. With Ginger   Yes [provider]  Ubrogepant (UBRELVY) 100 MG TABS Take 100 mg by mouth daily as needed (migraine).   Yes [provider]  Venlafaxine HCl 225 MG TB24 Take 225 mg by mouth daily.   Yes [provider]    Allergies as of 10/15/2022 - Review Complete 10/15/2022  Allergen Reaction Noted   Lifitegrast Other (See Comments) 05/28/2022   Other Other (See Comments) 03/07/2020   Sulfa antibiotics  12/10/2015   Penicillins Rash  12/10/2015    Family History  Problem Relation Age of Onset   Colon polyps Sister        pre-cancerous, numerous, required partial colectomy    Social History   Socioeconomic History   Marital status: Married    Spouse name: Not on file   Number of children: Not on file   Years of education: Not on file   Highest education level: Not on file  Occupational History   Not on file  Tobacco Use   Smoking status: Never   Smokeless tobacco: Never  Vaping Use   Vaping status: Never Used  Substance and Sexual Activity   Alcohol use: Yes    Comment: occasionally   Drug use: No   Sexual activity: Yes  Other Topics Concern   Not on file  Social History Narrative   Not on file   Social Determinants of Health   Financial Resource Strain: Not on file  Food  Insecurity: Not on file  Transportation Needs: Not on file  Physical Activity: Not on file  Stress: Not on file  Social Connections: Unknown (06/03/2021)   [facility]   Social Network    Social Network: Not on file  Intimate Partner Violence: Unknown (05/14/2021)   Received from [facility], [facility]   HITS    Physically Hurt: Not on file    Insult or Talk Down To: Not on file    Threaten Physical Harm: Not on file    Scream or Curse: Not on file    Review of Systems: See HPI, otherwise negative ROS  Physical Exam: BP (!) 142/84 (BP Location: Right Arm, Patient Position: Sitting, Cuff Size: Normal)   Pulse 71   Temp 98.6 F (37 C) (Oral)   Ht 5' (1.524 m)   Wt 144 lb 9.6 oz (65.6 kg)   SpO2 98%   BMI 28.24 kg/m  General:   Alert,  Well-developed, well-nourished, pleasant and cooperative in NAD Abdomen: Non-distended, normal bowel sounds.  Soft and nontender without appreciable mass or hepatosplenomegaly.  Pulses:  Normal pulses noted. Extremities:  Without clubbing or edema.  Impression/Plan: 62 year old lady with a 3-year history of esophageal dysphagia.  EGD with  empiric dilation of a normal patent appearing tubular esophagus did not improve her symptoms.  Follow-up barium pill esophagram demonstrated some dysmotility but no obstruction.  Patient may have underlying esophageal motility disorder.  She does take opioids daily.  The next step would be  esophageal manometry.  GERD is well-controlled on omeprazole.  She does take her PPI incorrectly.  The patient managed well with MiraLAX daily.  Due for high risk screening colonoscopy in 4 years.  Recommendations:  Proceed with esophageal manometry.  The technical aspects this procedure were reviewed with the patient.  She is agreeable.  Continue omeprazole 40 mg daily she needs to take it before meal and not at bedtime.  Continue MiraLAX as needed for constipation  Plan for high risk screening colonoscopy 4 years from now  Further recommendations to follow.   Notice: This dictation was prepared with Dragon dictation along with smaller phrase technology. Any transcriptional errors that result from this process are unintentional and may not be corrected upon review.

## 2022-12-19 ENCOUNTER — Other Ambulatory Visit: Payer: Self-pay

## 2022-12-19 ENCOUNTER — Telehealth: Payer: Self-pay

## 2022-12-19 DIAGNOSIS — K219 Gastro-esophageal reflux disease without esophagitis: Secondary | ICD-10-CM

## 2022-12-19 DIAGNOSIS — R1310 Dysphagia, unspecified: Secondary | ICD-10-CM

## 2022-12-19 NOTE — Telephone Encounter (Signed)
Referral for esophageal manometry Referring provider Dr Kendell Bane   Attempted phone contact with the patient. No answer. Left message to return my call.  Esophageal manometry 04/16/23 at 10:30 am.

## 2022-12-24 NOTE — Telephone Encounter (Signed)
Information letter mailed to the patient

## 2022-12-25 NOTE — Telephone Encounter (Signed)
Spoke with the patient. She agrees to the date and time offered.

## 2023-04-16 ENCOUNTER — Ambulatory Visit (HOSPITAL_COMMUNITY)
Admission: RE | Admit: 2023-04-16 | Discharge: 2023-04-16 | Disposition: A | Discharge status: Home / Self Care | Payer: BC Managed Care – PPO | Attending: Gastroenterology | Admitting: Gastroenterology

## 2023-04-16 ENCOUNTER — Encounter (HOSPITAL_COMMUNITY)
Admission: RE | Disposition: A | Discharge status: Home / Self Care | Payer: Self-pay | Source: Home / Self Care | Attending: Gastroenterology

## 2023-04-16 DIAGNOSIS — R1310 Dysphagia, unspecified: Secondary | ICD-10-CM | POA: Diagnosis not present

## 2023-04-16 DIAGNOSIS — K219 Gastro-esophageal reflux disease without esophagitis: Secondary | ICD-10-CM | POA: Insufficient documentation

## 2023-04-16 DIAGNOSIS — K224 Dyskinesia of esophagus: Secondary | ICD-10-CM

## 2023-04-16 HISTORY — PX: ESOPHAGEAL MANOMETRY: SHX5429

## 2023-04-16 SURGERY — MANOMETRY, ESOPHAGUS
Anesthesia: Choice

## 2023-04-16 MED ORDER — LIDOCAINE VISCOUS HCL 2 % MT SOLN
OROMUCOSAL | Status: AC
  Filled 2023-04-16: qty 15

## 2023-04-16 SURGICAL SUPPLY — 2 items
FACESHIELD LNG OPTICON STERILE (SAFETY) IMPLANT
GLOVE BIO SURGEON STRL SZ8 (GLOVE) ×2 IMPLANT

## 2023-04-16 NOTE — Progress Notes (Signed)
 Esophageal Manometry done per protocol. Patient tolerated well without distress or complication.

## 2023-04-18 ENCOUNTER — Encounter (HOSPITAL_COMMUNITY): Payer: Self-pay | Admitting: Gastroenterology

## 2023-05-02 ENCOUNTER — Telehealth: Payer: Self-pay | Admitting: Gastroenterology

## 2023-05-02 NOTE — Telephone Encounter (Signed)
Patient calling in regards to results. Please advise.   Thank you

## 2023-05-02 NOTE — Telephone Encounter (Signed)
 Pt calling and requesting results from esophageal manometry done on 04/16/2023.  Please advise

## 2023-05-05 NOTE — Telephone Encounter (Signed)
 The pt has been advised that Dr Lavon Paganini is aware that she is calling for results.

## 2023-05-05 NOTE — Telephone Encounter (Signed)
 PT is calling about results  of EM. please advise.

## 2023-05-21 ENCOUNTER — Telehealth: Payer: Self-pay

## 2023-05-21 NOTE — Telephone Encounter (Signed)
 Pt called wanting to know the results to the esophageal manometry that was done in March. The results are viewable in results. Pt is aware that you are out of the office this week.

## 2023-05-26 ENCOUNTER — Other Ambulatory Visit: Payer: Self-pay | Admitting: *Deleted

## 2023-05-26 DIAGNOSIS — K219 Gastro-esophageal reflux disease without esophagitis: Secondary | ICD-10-CM

## 2023-05-26 DIAGNOSIS — R1310 Dysphagia, unspecified: Secondary | ICD-10-CM

## 2023-05-26 NOTE — Telephone Encounter (Signed)
 Referral sent

## 2023-05-26 NOTE — Telephone Encounter (Signed)
 Pt was made aware and verbalized understanding. Pt stated that she would not be able to wean off of the narcotics due to having too much back pain. Pt is willing to go to speech therapy. Pt was scheduled a f/u visit with Shana Daring PA-C on July 28, 2023.

## 2023-07-03 ENCOUNTER — Other Ambulatory Visit: Payer: Self-pay

## 2023-07-03 ENCOUNTER — Ambulatory Visit (HOSPITAL_COMMUNITY): Attending: Internal Medicine | Admitting: Speech Pathology

## 2023-07-03 DIAGNOSIS — R1310 Dysphagia, unspecified: Secondary | ICD-10-CM | POA: Diagnosis not present

## 2023-07-03 DIAGNOSIS — K219 Gastro-esophageal reflux disease without esophagitis: Secondary | ICD-10-CM | POA: Insufficient documentation

## 2023-07-03 DIAGNOSIS — R1319 Other dysphagia: Secondary | ICD-10-CM | POA: Insufficient documentation

## 2023-07-03 NOTE — Therapy (Signed)
 OUTPATIENT SPEECH LANGUAGE PATHOLOGY SWALLOW EVALUATION   Patient Name: Ashley Hickman MRN: [id number] DOB:01/22/1961, 63 y.o., female Today's Date: 07/03/2023  PCP: Sallyann Crea, NP REFERRING PROVIDER: Suzette Espy, MD  END OF SESSION:  End of Session - 07/03/23 1117     Visit Number 1    Number of Visits 1    Authorization Type [employer] PPO    SLP Start Time 1020    SLP Stop Time  1100    SLP Time Calculation (min) 40 min    Activity Tolerance Patient tolerated treatment well             Past Medical History:  Diagnosis Date   CKD (chronic kidney disease)    Depression    GERD (gastroesophageal reflux disease)    Hypertension    Migraine    PONV (postoperative nausea and vomiting)    Sleep apnea    T2DM (type 2 diabetes mellitus) (HCC)    Past Surgical History:  Procedure Laterality Date   ABDOMINAL HYSTERECTOMY     BIOPSY  05/28/2021   Procedure: BIOPSY;  Surgeon: Suzette Espy, MD;  [facility];  Service: Endoscopy;;   CESAREAN SECTION     COLONOSCOPY WITH PROPOFOL  N/A 05/28/2021   Procedure: COLONOSCOPY WITH PROPOFOL ;  Surgeon: Suzette Espy, MD;  [facility];  Service: Endoscopy;  Laterality: N/A;  7:30am   COLONOSCOPY WITH PROPOFOL  N/A 08/23/2021   Procedure: COLONOSCOPY WITH PROPOFOL ;  Surgeon: Suzette Espy, MD;  [facility];  Service: Endoscopy;  Laterality: N/A;  1:00pm   ESOPHAGEAL MANOMETRY N/A 04/16/2023   Procedure: MANOMETRY, ESOPHAGUS;  Surgeon: Sergio Dandy, MD;  [facility];  Service: Gastroenterology;  Laterality: N/A;   ESOPHAGOGASTRODUODENOSCOPY (EGD) WITH PROPOFOL  N/A 05/28/2021   Procedure: ESOPHAGOGASTRODUODENOSCOPY (EGD) WITH PROPOFOL ;  Surgeon: Suzette Espy, MD;  [facility];  Service: Endoscopy;  Laterality: N/A;   MALONEY DILATION N/A 05/28/2021   Procedure: Londa Rival DILATION;  Surgeon: Suzette Espy, MD;  [facility];  Service: Endoscopy;   Laterality: N/A;   PLACEMENT OF BREAST IMPLANTS     SHOULDER SURGERY Right    TONSILLECTOMY     Patient Active Problem List   Diagnosis Date Noted   Dysphagia 04/11/2021   Hemorrhoids 04/11/2021   Rectal bleeding 04/11/2021   Constipation 04/11/2021   History of colonic polyps 04/11/2021    ONSET DATE: 04/16/2023   REFERRING DIAG: R13.10 (ICD-10-CM) - Dysphagia, unspecified type K21.9 (ICD-10-CM) - Gastroesophageal reflux disease without esophagitis  THERAPY DIAG:  Esophageal dysphagia  Rationale for Evaluation and Treatment: Rehabilitation  SUBJECTIVE:   SUBJECTIVE STATEMENT: "It feels like meat gets stuck." Pt accompanied by: self  PERTINENT HISTORY: Endora Raglin is a 63 year old female who was referred for a clinical swallow evaluation by Dr. Garnette Ka due to a 3-year history of esophageal dysphagia.  EGD with empiric dilation of a normal patent appearing tubular esophagus did not improve her symptoms.  Follow-up barium pill esophagram demonstrated some dysmotility but no obstruction. She had esophageal manometry completed in March which showed "normal relaxation of the EG junction, ineffective esophageal motility with poor bolus clearance (60% weak or failed esophageal contractions)". GERD is well-controlled on omeprazole. Pt has significant mid back pain and takes hydrocodone 2x daily (she also takes Robaxin, Abilify, Venlafaxine, Lamictal, Namenda, and omeprazole).   PAIN:  Are you having pain? No and not related to swallowing  FALLS: Has patient fallen in last 6  months?  No  LIVING ENVIRONMENT: Lives with: lives with their family Lives in: House/apartment  PLOF:  Level of assistance: Independent with ADLs, Independent with IADLs Employment: Part-time employment  PATIENT GOALS: Improve swallowing  OBJECTIVE:  Note: Objective measures were completed at Evaluation unless otherwise noted. OBJECTIVE:   DIAGNOSTIC FINDINGS:   Esophagram  06/05/2022 IMPRESSION: Double contrast esophagram significant for moderate dysmotility and very small sliding hiatal hernia. No stricture or mass.  esophageal manometry done on 04/16/2023: "normal relaxation of the EG junction, ineffective esophageal motility with poor bolus clearance (60% weak or failed esophageal contractions)".    COGNITION: Overall cognitive status: Within functional limits for tasks assessed  SUBJECTIVE DYSPHAGIA REPORTS:  Date of onset: ~2021 Reported symptoms: globus sensation and difficulty initiating a swallow  Current diet: regular and thin liquids  Co-morbid voice changes: No  FACTORS WHICH MAY INCREASE RISK OF ADVERSE EVENT IN PRESENCE OF ASPIRATION:  General health: well appearing  Risk factors: GERD or other GI disease    ORAL MOTOR EXAMINATION: Overall status: WFL Comments: xerostomia  CLINICAL SWALLOW ASSESSMENT:   Dentition: adequate natural dentition Vocal quality at baseline: normal and hoarse Patient directly observed with POs: Yes: regular and thin liquids  Feeding: able to feed self Liquids provided by: cup [facility] Swallow Protocol: Pass Oral phase signs and symptoms: N/A Pharyngeal phase signs and symptoms: None                                                                                                                   TREATMENT DATE: 07/03/23  PATIENT EDUCATION: Education details: Reflux precautions Person educated: Patient Education method: Chief Technology Officer Education comprehension: verbalized understanding   ASSESSMENT:  CLINICAL IMPRESSION: Patient is a 63 y.o. female who was seen today for a clinical swallow evaluation at the request of Dr. Riley Cheadle. Pt presents with primary esophageal dysphagia as evidenced by previous barium swallow and manometry. Pt reports difficulty initiating a swallow (which is often exhibited in individuals with esophageal dysphagia) and globus sensation near her sternum. She reports increased  difficulty with chicken, pork chops, bread, and pills. Pt has xerostomia likely due to medications. Pt consumed regular textures and thin liquids in session without incident. SLP reviewed aspiration and reflux precaution precautions extensively with Pt with recommendations to increase water intake, add moisture to dry solids, consume frequent, smaller meals, cut up meats well, trial of alginate therapy (Reflux Gourmet) if allowed per her physicians, elevate the Pesqueira of her bed 30 degrees, and address back pain (due to impact of opioids on GI system). No further SLP services indicated at this time, however Pt has my contact information should she have further concerns or questions.   OBJECTIVE IMPAIRMENTS: include dysphagia. These impairments are limiting patient from safety when swallowing. Factors affecting potential to achieve goals and functional outcome are co-morbidities and medication effects on esophageal swallow.   REHAB POTENTIAL: Fair , no oropharyngeal exercises recommended. Pt education regarding reflux precautions completed.   PLAN:  SLP FREQUENCY: one time visit  Thank you,  Claudetta Cuba, CCC-SLP [phone]  Liyana Suniga, CCC-SLP 07/03/2023, 11:47 AM

## 2023-07-26 NOTE — Progress Notes (Unsigned)
 Referring Provider: Estelle Delon SHAUNNA DEWAINE Primary Care Physician:  Estelle Delon SHAUNNA, NP Primary GI Physician: Dr. Shaaron  Chief Complaint  Patient presents with   Follow-up    HPI:   Ashley Hickman is a 63 y.o. female presenting today for follow-up of dysphagia which has been present for at least 3 years.    Workup thus far includes EGD which revealed a normal-appearing tubular esophagus; 54 French Maloney dilator passed empirically.  No improvement.  Barium pill esophagram with moderate dysmotility, 13 mm barium tablet passed normally.  Esophageal manometry with ineffective esophageal motility with poor bolus clearance, normal relaxation of EG junction.  Dr. Shaaron suspected this was likely medication related - narcotic related most likely with other meds (for depression, migraines also possibly contributing.  Recommended weaning narcotics, but patient stated she would not be able to due to having too much back pain.  Dr. Shaaron also stated it may be worth while for patient to see speech therapy for assistance with bolus intake and minimizing overloading the esophagus.   SLP evaluation 07/03/2023.  She consumed regular textures and thin liquids without incident.  Noted xerostomia likely due to medications.  SLP reviewed aspiration and reflux precautions.  Recommended increasing water intake, add moisture to dry foods, consume frequent, smaller meals, cut up meats well, trial of alginate therapy (Reflux Gourmet) if allowed per her physicians.  Elevate the Josephs of her bed 30 degrees, and address back pain (due to impact of opioids on GI system).   Today: Managing dysphagia ok with dietary changes.   Plan to talk with back doctor about physical therapy. Can't go without narcotic pain medication at this time.   Chronic issues with constipation. Taking colace 100 mg BID. Was taking MiraLAX prn, but would take 3-4 days to work. Having BMs every 3-4 days.  Never took MiraLAX consistently.  No  abdominal pain, BRBPR, melena.  Colonoscopy up to date in 2023. Due for surveillance in 2028.    Past Medical History:  Diagnosis Date   CKD (chronic kidney disease)    Depression    GERD (gastroesophageal reflux disease)    Hypertension    Migraine    PONV (postoperative nausea and vomiting)    Sleep apnea    T2DM (type 2 diabetes mellitus) (HCC)     Past Surgical History:  Procedure Laterality Date   ABDOMINAL HYSTERECTOMY     BIOPSY  05/28/2021   Procedure: BIOPSY;  Surgeon: Shaaron Lamar HERO, MD;  [facility];  Service: Endoscopy;;   CESAREAN SECTION     COLONOSCOPY WITH PROPOFOL  N/A 05/28/2021   Procedure: COLONOSCOPY WITH PROPOFOL ;  Surgeon: Shaaron Lamar HERO, MD;  [facility];  Service: Endoscopy;  Laterality: N/A;  7:30am   COLONOSCOPY WITH PROPOFOL  N/A 08/23/2021   Procedure: COLONOSCOPY WITH PROPOFOL ;  Surgeon: Shaaron Lamar HERO, MD;  [facility];  Service: Endoscopy;  Laterality: N/A;  1:00pm   ESOPHAGEAL MANOMETRY N/A 04/16/2023   Procedure: MANOMETRY, ESOPHAGUS;  Surgeon: Shila Gustav GAILS, MD;  [facility];  Service: Gastroenterology;  Laterality: N/A;   ESOPHAGOGASTRODUODENOSCOPY (EGD) WITH PROPOFOL  N/A 05/28/2021   Procedure: ESOPHAGOGASTRODUODENOSCOPY (EGD) WITH PROPOFOL ;  Surgeon: Shaaron Lamar HERO, MD;  [facility];  Service: Endoscopy;  Laterality: N/A;   MALONEY DILATION N/A 05/28/2021   Procedure: AGAPITO DILATION;  Surgeon: Shaaron Lamar HERO, MD;  [facility];  Service: Endoscopy;  Laterality: N/A;   PLACEMENT OF BREAST IMPLANTS  SHOULDER SURGERY Right    TONSILLECTOMY      Current Outpatient Medications  Medication Sig Dispense Refill   acetaminophen (TYLENOL) 650 MG CR tablet Take 1,300 mg by mouth every 8 (eight) hours as needed for pain.     ARIPiprazole (ABILIFY) 10 MG tablet Take 10 mg by mouth daily.     Atogepant (QULIPTA) 60 MG TABS Take 60 mg by mouth daily.     Biotin 2500 MCG CHEW  Chew 5,000 mcg by mouth daily.     Calcium Citrate-Vitamin D 315-5 MG-MCG TABS Take 2 tablets by mouth daily.     Cholecalciferol (VITAMIN D3) 25 MCG (1000 UT) CAPS Take 1,000 Units by mouth daily.     COLACE 100 MG capsule Take 100 mg by mouth 2 (two) times daily.     Embrace Lancets Ultra Thin 30G MISC USE AS DIRECTED ONCE DAILY testing AT varying TIMES (fasting AND 2 HOURS AFTER largest MEAL)     Eszopiclone 3 MG TABS Take 3 mg by mouth at bedtime as needed.     Flaxseed, Linseed, (FLAXSEED OIL) 1200 MG CAPS Take 1,200 mg by mouth daily.     folic acid (FOLVITE) 800 MCG tablet Take 800 mcg by mouth at bedtime.     glucose blood (EASYMAX TEST) test strip USE AS DIRECTED testing ONCE A DAY varying TIMES (fasting AND 2 HOURS AFTER largest MEAL)     hydrochlorothiazide (MICROZIDE) 12.5 MG capsule Take 12.5 mg by mouth daily.     HYDROcodone-acetaminophen (NORCO) 10-325 MG tablet Take 1 tablet by mouth every 6 (six) hours as needed for severe pain.     lamoTRIgine (LAMICTAL) 200 MG tablet 1 tablet orally once daily for 30 days     lisinopril (ZESTRIL) 40 MG tablet Take 40 mg by mouth daily.     memantine (NAMENDA) 10 MG tablet Take 10 mg by mouth daily.     methocarbamol (ROBAXIN) 500 MG tablet Take 500 mg by mouth 3 (three) times daily as needed for muscle spasms.     nitrofurantoin (MACRODANTIN) 100 MG capsule Take 100 mg by mouth at bedtime as needed (UTI).     omeprazole (PRILOSEC) 40 MG capsule Take 40 mg by mouth daily.     OZEMPIC, 0.25 OR 0.5 MG/DOSE, 2 MG/3ML SOPN INJECT 0.25 MG subcutaneously ONCE WEEKLY FOR 4 WEEKS THEN 0.5 MG WEEKLY FOR 4 WEEKS. contact office ON WEEK 4     Probiotic Product (PROBIOTIC DAILY PO) Take 1 capsule by mouth daily.     propranolol (INDERAL) 10 MG tablet Take 20 mg by mouth 2 (two) times daily.     rosuvastatin (CRESTOR) 20 MG tablet Take 20 mg by mouth at bedtime.     tolterodine (DETROL LA) 4 MG 24 hr capsule Take 4 mg by mouth daily.     Venlafaxine HCl  225 MG TB24 Take 225 mg by mouth daily.     No current facility-administered medications for this visit.    Allergies as of 07/28/2023 - Review Complete 07/28/2023  Allergen Reaction Noted   Lifitegrast Other (See Comments) 05/28/2022   Other Other (See Comments) 03/07/2020   Sulfa antibiotics  12/10/2015   Penicillins Rash 12/10/2015    Family History  Problem Relation Age of Onset   Colon polyps Sister        pre-cancerous, numerous, required partial colectomy    Social History   Socioeconomic History   Marital status: Married    Spouse name: Not on file  Number of children: Not on file   Years of education: Not on file   Highest education level: Not on file  Occupational History   Not on file  Tobacco Use   Smoking status: Never   Smokeless tobacco: Never  Vaping Use   Vaping status: Never Used  Substance and Sexual Activity   Alcohol use: Yes    Comment: occasionally   Drug use: No   Sexual activity: Yes  Other Topics Concern   Not on file  Social History Narrative   Not on file   Social Drivers of Health   Financial Resource Strain: Not on file  Food Insecurity: Not on file  Transportation Needs: Not on file  Physical Activity: Not on file  Stress: Not on file  Social Connections: Unknown (06/03/2021)   Received from [facility]   Social Network    Social Network: Not on file    Review of Systems: Gen: Denies fever, chills, cold or flulike symptoms, presyncope, syncope. GI: See HPI Heme: See HPI  Physical Exam: BP 118/77 (BP Location: Right Arm, Patient Position: Sitting, Cuff Size: Normal)   Pulse 79   Temp 98.1 F (36.7 C) (Oral)   Ht 5' (1.524 m)   Wt 142 lb (64.4 kg)   SpO2 98%   BMI 27.73 kg/m  General:   Alert and oriented. No distress noted. Pleasant and cooperative.  Shipley:  Normocephalic and atraumatic. Eyes:  Conjuctiva clear without scleral icterus. Abdomen:  +BS, soft, non-tender and non-distended. No rebound or guarding. No  HSM or masses noted. Msk:  Symmetrical without gross deformities. Normal posture. Extremities:  Without edema. Neurologic:  Alert and  oriented x4 Psych:  Normal mood and affect.    Assessment:  63 year old female presenting today for follow-up of dysphagia and also discussed chronic constipation.  Dysphagia: Secondary to esophageal dysmotility.  EGD in 2023 with no improvement with esophageal dilation.  Barium pill esophagram with moderate dysmotility.  Esophageal manometry with ineffective esophageal motility with poor bolus clearance, normal relaxation of EG junction.  This was felt to be likely medication related-most likely secondary to chronic pain medications but also other medications for depression and migraines may be contributing.  Unfortunately, patient is not able to discontinue narcotic pain medication at this time, but she does plan to follow-up with her back doctor in the near future to discuss other options and physical therapy in efforts to minimize use of narcotics.  Clinically, she is managing fairly well with dietary/lifestyle changes.  She has seen SLP who had extensive discussion regarding dietary/lifestyle management.  Chronic constipation: Likely driven by chronic pain medications.  Not adequately managed with Colace 100 mg twice daily.  She is only taken MiraLAX periodically which does work after taking for a few days.  Suspect this may work more effectively for her if she takes consistently.  If not, we can try prescription management.  Colonoscopy up-to-date.  Due for surveillance in 2028.    Plan:  Dysphagia precautions:  Eat slowly, take small bites, chew thoroughly, drink plenty of liquids throughout meals.  Avoid trough and dry textures All meats should be chopped finely.  Start MiraLAX 17 g daily. Agree with patient following up with orthopedics to discuss other options for pain management to try to minimize opioid therapy. Follow-up in 6 months or sooner if  needed.   Josette Centers, PA-C [facility] 07/28/2023

## 2023-07-28 ENCOUNTER — Ambulatory Visit: Admitting: Gastroenterology

## 2023-07-28 ENCOUNTER — Encounter: Payer: Self-pay | Admitting: Gastroenterology

## 2023-07-28 VITALS — BP 118/77 | HR 79 | Temp 98.1°F | Ht 60.0 in | Wt 142.0 lb

## 2023-07-28 DIAGNOSIS — K5909 Other constipation: Secondary | ICD-10-CM | POA: Diagnosis not present

## 2023-07-28 DIAGNOSIS — R1310 Dysphagia, unspecified: Secondary | ICD-10-CM

## 2023-07-28 DIAGNOSIS — K224 Dyskinesia of esophagus: Secondary | ICD-10-CM | POA: Diagnosis not present

## 2023-07-28 DIAGNOSIS — K5900 Constipation, unspecified: Secondary | ICD-10-CM

## 2023-07-28 NOTE — Patient Instructions (Signed)
 Start Miralax 17 g daily in 8 oz of water or other non-carbonated beverage of your choice.   Follow-up with your back doctor to discuss other options for pain management to try to minimize opioid therapy as much as possible.   Swallowing precautions:  Eat slowly, take small bites, chew thoroughly, drink plenty of liquids throughout meals.  Avoid trough textures Avoid dry textures All meats should be chopped finely.    It was good to see you today!   Josette Centers, PA-C [facility]

## 2023-09-09 ENCOUNTER — Telehealth: Payer: Self-pay

## 2023-09-09 ENCOUNTER — Other Ambulatory Visit: Payer: Self-pay | Admitting: Gastroenterology

## 2023-09-09 DIAGNOSIS — K219 Gastro-esophageal reflux disease without esophagitis: Secondary | ICD-10-CM

## 2023-09-09 MED ORDER — PANTOPRAZOLE SODIUM 40 MG PO TBEC: 40.0000 mg | DELAYED_RELEASE_TABLET | Freq: Every day | ORAL | 3 refills | Status: DC

## 2023-09-09 NOTE — Telephone Encounter (Signed)
 Pt states that she was instructed to take it 30 mins before dinner because she was having bad reflux at night, but states that now she wakes up in the morning regurgitating because of the reflux.

## 2023-09-09 NOTE — Telephone Encounter (Signed)
 Noted. I will send in Pantoprazole  40 mg daily. She will take this in place of omeprazole.   She also needs to follow GERD diet/lifestyle: Avoid fried, fatty, greasy, spicy, citrus foods. Avoid caffeine and carbonated beverages. Avoid chocolate. Try eating 4-6 small meals a day rather than 3 large meals. Last meal of the day should not be the largest.  Do not eat within 3 hours of laying down. Prop Prospero of bed up on wood or bricks to create a 6 inch incline.   Recommend arranging follow-up in 8 weeks to see how she is doing. She can see Dr. Shaaron or other APP.

## 2023-09-09 NOTE — Telephone Encounter (Signed)
 Pt was made aware and verbalized understanding. F/u appt was made.

## 2023-09-09 NOTE — Telephone Encounter (Signed)
 Pt called stating that she is having problems with heartburn. Pt is currently taking omeprazole 40mg  daily and states that it is not working. Pt is wanting to know if it can be changed.

## 2023-09-09 NOTE — Telephone Encounter (Signed)
 Is she taking the medication in the morning, 30 minutes before breakfast? If she isn't, recommend doing this first. If she is already taking medication correctly, we can try her on pantoprazole  40 mg daily. If ongoing symptoms, needs to schedule follow-up.

## 2023-11-04 ENCOUNTER — Encounter: Payer: Self-pay | Admitting: Gastroenterology

## 2023-11-04 ENCOUNTER — Ambulatory Visit (INDEPENDENT_AMBULATORY_CARE_PROVIDER_SITE_OTHER): Admitting: Gastroenterology

## 2023-11-04 VITALS — BP 111/73 | HR 89 | Temp 97.6°F | Ht 60.0 in | Wt 133.6 lb

## 2023-11-04 DIAGNOSIS — K5900 Constipation, unspecified: Secondary | ICD-10-CM | POA: Diagnosis not present

## 2023-11-04 DIAGNOSIS — K219 Gastro-esophageal reflux disease without esophagitis: Secondary | ICD-10-CM | POA: Diagnosis not present

## 2023-11-04 DIAGNOSIS — R1310 Dysphagia, unspecified: Secondary | ICD-10-CM

## 2023-11-04 MED ORDER — PANTOPRAZOLE SODIUM 40 MG PO TBEC: 40.0000 mg | DELAYED_RELEASE_TABLET | Freq: Every day | ORAL | 3 refills | Status: AC

## 2023-11-04 NOTE — Patient Instructions (Signed)
 Continue pantoprazole  40 mg in the evenings at least 30 minutes prior to dinner or 2-3 hours after dinner before bedtime.  It is best taken on empty stomach to be most effective.  I sent a 90-day refill for you today.  Please are taking famotidine (Pepcid) 20 mg in the mornings on empty stomach at least 30 minutes prior to breakfast.  If this still is not effective we can consider increasing pantoprazole  to twice daily.  Continue taking your MiraLAX every day, continuing a half a capful for at least a good daily bowel movement and to avoid significantly loose stools.  You may increase to a capful as needed if having any worsening constipation symptoms.  Continue increasing fiber in your diet as able.  If needed you could consider fiber supplementation such as Benefiber or Metamucil.  Follow up in 6 months, sooner if needed.   It was a pleasure to see you today. I want to create trusting relationships with patients. If you receive a survey regarding your visit,  I greatly appreciate you taking time to fill this out on paper or through your MyChart. I value your feedback.  Charmaine Melia, MSN, FNP-BC, AGACNP-BC [facility]

## 2023-11-04 NOTE — Progress Notes (Signed)
 GI Office Note    Referring Provider: Estelle Delon SQUIBB,* Primary Care Physician:  Estelle Delon SQUIBB, NP Primary Gastroenterologist: Lamar HERO.Rourk, MD  Date:  11/04/2023  ID:  Lawrnce Sprague, DOB January 01, 1961, MRN [id number]   Chief Complaint   Chief Complaint  Patient presents with   Follow-up    Follow up. Still having some heart burn    History of Present Illness  Mizani Lemmerman is a 63 y.o. female with a history of GERD, dysphagia, depression, CKD, HTN, migraines, and diabetes presenting today with complaint of heartburn.   Per review of her chart her prior workup has included an upper endoscopy in April 2023 which revealed a normal-appearing tubular esophagus s/p empiric dilation without any noted improvement.  BPE with moderate dysmotility noted and barium tablet passed normally.  Underwent esophageal manometry with ineffective esophageal motility with poor bolus clearance and normal relaxation of EG junction.  In discussions with Dr. Shaaron he suspected that this was likely medication related, mostly narcotics or potentially other medications including depression and migraine medications.  He recommended weaning narcotics however patient having severe chronic back pain.  Discussed possible speech therapy assistance for bolus intake and minimizing overload of the esophagus.  Seen by speech therapy in May 2025.  Consumed regular textures and thin liquids without any incident.  Noted xerostomia likely due to medications.  SLP reviewed aspiration reflux precautions with her.  She was recommended to increase her water intake and add moisture to dry foods and to consume smaller more frequent meals and cut out meats well.  Advised possible use of reflux Gourmet if allowed by physicians and to remain elevated after meals.  They also advised addressing her back pain with other modalities to decrease the impact of opioids.  Last office visit 07/28/23 with Josette Centers, PA. Dysphagia  precautions discussed.  Advised to start MiraLAX 17 g once daily.  Advised patient to follow-up with orthopedics to discuss pain management options.  Follow-up in 6 months, sooner if needed.  Patient called in early August reporting issues with heartburn and was taking omeprazole 40 mg daily at the time.  She was advised to stop omeprazole and start pantoprazole  40 mg once daily and take in place of omeprazole and then follow-up in 8 weeks with Dr. Shaaron or another APP.  Today:  Discussed the use of AI scribe software for clinical note transcription with the patient, who gave verbal consent to proceed.  She experiences persistent heartburn, primarily noticeable in the morning upon waking. She takes pantoprazole  at night, which helps her sleep through the night but does not last into the next day. Without the medication, she experiences regurgitation. She has not tried taking the medication twice a day. She previously used omeprazole, which provided similar relief to pantoprazole . She has not tried famotidine or Pepcid in addition to her current regimen.  She started Ozempic for diabetes management, which she believes may have exacerbated her reflux symptoms. Initially on a higher dose of Ozempic, she was reduced to a lower dose due to nausea. Denies current nausea, vomiting, melena, or brbpr.   She experiences constipation, for which she takes Miralax once a day. She has adjusted her dose to half a capful to avoid loose stools. She consumes grapes daily, contributing to her fiber intake, although she does not consistently consume other high-fiber foods. She dislikes the texture of kiwi and prunes.  She has a history of difficulty swallowing, which was evaluated with testing, but it has not worsened.  No nausea, vomiting, abdominal pain, or bleeding. She takes hydrocodone twice a day for back pain, along with a muscle relaxer. More hydrocodone affects her bowel movements but does not worsen her  swallowing or dry mouth.      Wt Readings from Last 5 Encounters:  11/04/23 133 lb 9.6 oz (60.6 kg)  07/28/23 142 lb (64.4 kg)  10/15/22 144 lb 9.6 oz (65.6 kg)  05/28/22 139 lb 9.6 oz (63.3 kg)  08/21/21 139 lb 8.8 oz (63.3 kg)    Current Outpatient Medications  Medication Sig Dispense Refill   acetaminophen (TYLENOL) 650 MG CR tablet Take 1,300 mg by mouth every 8 (eight) hours as needed for pain.     ARIPiprazole (ABILIFY) 10 MG tablet Take 10 mg by mouth daily.     Atogepant (QULIPTA) 60 MG TABS Take 60 mg by mouth daily.     Calcium Citrate-Vitamin D 315-5 MG-MCG TABS Take 2 tablets by mouth daily.     Embrace Lancets Ultra Thin 30G MISC USE AS DIRECTED ONCE DAILY testing AT varying TIMES (fasting AND 2 HOURS AFTER largest MEAL)     Eszopiclone 3 MG TABS Take 3 mg by mouth at bedtime as needed.     Flaxseed, Linseed, (FLAXSEED OIL) 1200 MG CAPS Take 1,200 mg by mouth daily.     folic acid (FOLVITE) 800 MCG tablet Take 800 mcg by mouth at bedtime.     glucose blood (EASYMAX TEST) test strip USE AS DIRECTED testing ONCE A DAY varying TIMES (fasting AND 2 HOURS AFTER largest MEAL)     hydrochlorothiazide (MICROZIDE) 12.5 MG capsule Take 12.5 mg by mouth daily.     HYDROcodone-acetaminophen (NORCO) 10-325 MG tablet Take 1 tablet by mouth every 6 (six) hours as needed for severe pain.     lamoTRIgine (LAMICTAL) 200 MG tablet 1 tablet orally once daily for 30 days     lisinopril (ZESTRIL) 40 MG tablet Take 40 mg by mouth daily.     memantine (NAMENDA) 10 MG tablet Take 10 mg by mouth daily.     methocarbamol (ROBAXIN) 500 MG tablet Take 500 mg by mouth 3 (three) times daily as needed for muscle spasms.     nitrofurantoin (MACRODANTIN) 100 MG capsule Take 100 mg by mouth at bedtime as needed (UTI).     OZEMPIC, 0.25 OR 0.5 MG/DOSE, 2 MG/3ML SOPN INJECT 0.25 MG subcutaneously ONCE WEEKLY FOR 4 WEEKS THEN 0.5 MG WEEKLY FOR 4 WEEKS. contact office ON WEEK 4     pantoprazole  (PROTONIX ) 40 MG  tablet Take 1 tablet (40 mg total) by mouth daily before breakfast. 30 tablet 3   Probiotic Product (PROBIOTIC DAILY PO) Take 1 capsule by mouth daily.     propranolol (INDERAL) 10 MG tablet Take 20 mg by mouth 2 (two) times daily.     rosuvastatin (CRESTOR) 20 MG tablet Take 20 mg by mouth at bedtime.     tolterodine (DETROL LA) 4 MG 24 hr capsule Take 4 mg by mouth daily.     Venlafaxine HCl 225 MG TB24 Take 225 mg by mouth daily.     No current facility-administered medications for this visit.    Past Medical History:  Diagnosis Date   CKD (chronic kidney disease)    Depression    GERD (gastroesophageal reflux disease)    Hypertension    Migraine    PONV (postoperative nausea and vomiting)    Sleep apnea    T2DM (type 2 diabetes mellitus) (HCC)  Past Surgical History:  Procedure Laterality Date   ABDOMINAL HYSTERECTOMY     BIOPSY  05/28/2021   Procedure: BIOPSY;  Surgeon: Shaaron Lamar HERO, MD;  [facility];  Service: Endoscopy;;   CESAREAN SECTION     COLONOSCOPY WITH PROPOFOL  N/A 05/28/2021   Procedure: COLONOSCOPY WITH PROPOFOL ;  Surgeon: Shaaron Lamar HERO, MD;  [facility];  Service: Endoscopy;  Laterality: N/A;  7:30am   COLONOSCOPY WITH PROPOFOL  N/A 08/23/2021   Procedure: COLONOSCOPY WITH PROPOFOL ;  Surgeon: Shaaron Lamar HERO, MD;  [facility];  Service: Endoscopy;  Laterality: N/A;  1:00pm   ESOPHAGEAL MANOMETRY N/A 04/16/2023   Procedure: MANOMETRY, ESOPHAGUS;  Surgeon: Shila Gustav GAILS, MD;  Location: [facility];  Service: Gastroenterology;  Laterality: N/A;   ESOPHAGOGASTRODUODENOSCOPY (EGD) WITH PROPOFOL  N/A 05/28/2021   Procedure: ESOPHAGOGASTRODUODENOSCOPY (EGD) WITH PROPOFOL ;  Surgeon: Shaaron Lamar HERO, MD;  [facility];  Service: Endoscopy;  Laterality: N/A;   MALONEY DILATION N/A 05/28/2021   Procedure: AGAPITO DILATION;  Surgeon: Shaaron Lamar HERO, MD;  [facility];  Service: Endoscopy;  Laterality: N/A;    PLACEMENT OF BREAST IMPLANTS     SHOULDER SURGERY Right    TONSILLECTOMY      Family History  Problem Relation Age of Onset   Colon polyps Sister        pre-cancerous, numerous, required partial colectomy    Allergies as of 11/04/2023 - Review Complete 11/04/2023  Allergen Reaction Noted   Lifitegrast Other (See Comments) 05/28/2022   Other Other (See Comments) 03/07/2020   Sulfa antibiotics  12/10/2015   Penicillins Rash 12/10/2015    Social History   Socioeconomic History   Marital status: Married    Spouse name: Not on file   Number of children: Not on file   Years of education: Not on file   Highest education level: Not on file  Occupational History   Not on file  Tobacco Use   Smoking status: Never   Smokeless tobacco: Never  Vaping Use   Vaping status: Never Used  Substance and Sexual Activity   Alcohol use: Yes    Comment: occasionally   Drug use: No   Sexual activity: Yes  Other Topics Concern   Not on file  Social History Narrative   Not on file   Social Drivers of Health   Financial Resource Strain: Not on file  Food Insecurity: Not on file  Transportation Needs: Not on file  Physical Activity: Not on file  Stress: Not on file  Social Connections: Unknown (06/03/2021)   Received from [facility]   Social Network    Social Network: Not on file     Review of Systems   Gen: Denies fever, chills, anorexia. Denies fatigue, weakness, weight loss.  CV: Denies chest pain, palpitations, syncope, peripheral edema, and claudication. Resp: Denies dyspnea at rest, cough, wheezing, coughing up blood, and pleurisy. GI: See HPI Derm: Denies rash, itching, dry skin Psych: Denies depression, anxiety, memory loss, confusion. No homicidal or suicidal ideation.  Heme: Denies bruising, bleeding, and enlarged lymph nodes. + back pain   Physical Exam   BP 111/73 (BP Location: Right Arm, Patient Position: Sitting, Cuff Size: Normal)   Pulse 89   Temp 97.6 F  (36.4 C) (Temporal)   Ht 5' (1.524 m)   Wt 133 lb 9.6 oz (60.6 kg)   BMI 26.09 kg/m   General:   Alert and oriented. No distress noted. Pleasant and cooperative.  All:  Normocephalic and atraumatic. Eyes:  Conjuctiva clear without scleral icterus. Mouth:  Oral mucosa pink and moist. Good dentition. No lesions. Abdomen: non-distended Rectal: deferred Msk:  Symmetrical without gross deformities. Normal posture. Extremities:  Without edema. Neurologic:  Alert and  oriented x4 Psych:  Alert and cooperative. Normal mood and affect.  Assessment & Plan  Forever Arechiga is a 63 y.o. female presenting today with ongoing heartburn.      Gastroesophageal reflux disease (GERD) GERD symptoms persist with heartburn, previously primarily at night and now more so in the morning. Current treatment with pantoprazole  at night provides partial relief but does not last through the morning. Symptoms may be exacerbated by Ozempic, known to cause reflux. Long-term use of proton pump inhibitors like pantoprazole  carries risks, so minimizing dosage is preferred if possible. Famotidine is a cost-effective alternative available over the counter. - Initiate famotidine 20 mg in the morning before breakfast. - Continue pantoprazole  40 mg in the evening, ensuring it is taken at least 30 minutes before a meal or 2-3 hours after dinner (at bedtime). - Refill pantoprazole  for 90 days. - GERD diet advised.   Dysphagia Dysphagia is present with no worsening of symptoms. Previous tests suggested a motility issue. No new symptoms such as nausea or vomiting reported. - Continue dysphagia precautions including moistened foods, alternating small bites with sips of liquids.   Constipation Constipation is managed with Miralax, effective at a reduced dose of half a capful daily to prevent overly loose stools. Previously used 1 capful daily. Dietary fiber intake is variable, with some reliance on fruits like grapes. - Continue  Miralax at half a capful daily. - Adjust Miralax dosage as needed based on bowel movement regularity. - Continue high fiber diet as able.      Follow up    Follow up 6 months, sooner if needed.     Charmaine Melia, MSN, FNP-BC, AGACNP-BC [facility]

## 2024-02-10 ENCOUNTER — Encounter: Payer: Self-pay | Admitting: Internal Medicine

## 2024-02-10 ENCOUNTER — Ambulatory Visit: Admitting: Internal Medicine

## 2024-02-10 VITALS — BP 147/82 | HR 74 | Temp 98.7°F | Ht 60.0 in | Wt 128.6 lb

## 2024-02-10 DIAGNOSIS — R1310 Dysphagia, unspecified: Secondary | ICD-10-CM

## 2024-02-10 DIAGNOSIS — K469 Unspecified abdominal hernia without obstruction or gangrene: Secondary | ICD-10-CM | POA: Diagnosis not present

## 2024-02-10 DIAGNOSIS — K5900 Constipation, unspecified: Secondary | ICD-10-CM | POA: Diagnosis not present

## 2024-02-10 DIAGNOSIS — K219 Gastro-esophageal reflux disease without esophagitis: Secondary | ICD-10-CM

## 2024-02-10 DIAGNOSIS — Z83719 Family history of colon polyps, unspecified: Secondary | ICD-10-CM | POA: Diagnosis not present

## 2024-02-10 NOTE — Progress Notes (Signed)
 "   Gastroenterology Progress Note    Primary Care Physician:  Theo Rumaldo SAILOR, FNP Primary Gastroenterologist:  Dr. Shaaron  Pre-Procedure History & Physical: HPI:  Ashley Hickman is a 64 y.o. female here for follow-up of GERD dysmotility, dysphagia constipation.  States that her dysphagia is improving she takes Protonix  40 mg before supper for better nocturnal control reflux.  Famotidine 20 mg in the morning and as needed she is managing fairly well.  Constipation well-managed with MiraLAX 17 g orally at bedtime since she was last seen in September she has developed a right lower quadrant hernia for which she is scheduled to see [facility] [place] surgery in the near future.  States she has bilateral hernias the right is greater than the left.  She had a CT scan in [place]  but I cannot see the report.  I did find 1 from 2024 done for abdominal pain no hernia mentioned at that time.  Past Medical History:  Diagnosis Date   CKD (chronic kidney disease)    Depression    GERD (gastroesophageal reflux disease)    Hypertension    Migraine    PONV (postoperative nausea and vomiting)    Sleep apnea    T2DM (type 2 diabetes mellitus) (HCC)     Past Surgical History:  Procedure Laterality Date   ABDOMINAL HYSTERECTOMY     BIOPSY  05/28/2021   Procedure: BIOPSY;  Surgeon: Shaaron Lamar HERO, MD;  [facility];  Service: Endoscopy;;   CESAREAN SECTION     COLONOSCOPY WITH PROPOFOL  N/A 05/28/2021   Procedure: COLONOSCOPY WITH PROPOFOL ;  Surgeon: Shaaron Lamar HERO, MD;  [facility];  Service: Endoscopy;  Laterality: N/A;  7:30am   COLONOSCOPY WITH PROPOFOL  N/A 08/23/2021   Procedure: COLONOSCOPY WITH PROPOFOL ;  Surgeon: Shaaron Lamar HERO, MD;  [facility];  Service: Endoscopy;  Laterality: N/A;  1:00pm   ESOPHAGEAL MANOMETRY N/A 04/16/2023   Procedure: MANOMETRY, ESOPHAGUS;  Surgeon: Shila Gustav GAILS, MD;  [facility];  Service: Gastroenterology;  Laterality:  N/A;   ESOPHAGOGASTRODUODENOSCOPY (EGD) WITH PROPOFOL  N/A 05/28/2021   Procedure: ESOPHAGOGASTRODUODENOSCOPY (EGD) WITH PROPOFOL ;  Surgeon: Shaaron Lamar HERO, MD;  [facility];  Service: Endoscopy;  Laterality: N/A;   MALONEY DILATION N/A 05/28/2021   Procedure: AGAPITO DILATION;  Surgeon: Shaaron Lamar HERO, MD;  [facility];  Service: Endoscopy;  Laterality: N/A;   PLACEMENT OF BREAST IMPLANTS     SHOULDER SURGERY Right    TONSILLECTOMY      Prior to Admission medications  Medication Sig Start Date End Date Taking? Authorizing Provider  acetaminophen (TYLENOL) 650 MG CR tablet Take 1,300 mg by mouth every 8 (eight) hours as needed for pain.   Yes [provider]  ARIPiprazole (ABILIFY) 10 MG tablet Take 10 mg by mouth daily.   Yes [provider]  Atogepant (QULIPTA) 60 MG TABS Take 60 mg by mouth daily.   Yes [provider]  b complex vitamins capsule Take 1 capsule by mouth daily.   Yes [provider]  Calcium Citrate-Vitamin D 315-5 MG-MCG TABS Take 2 tablets by mouth daily.   Yes [provider]  Embrace Lancets Ultra Thin 30G MISC USE AS DIRECTED ONCE DAILY testing AT varying TIMES (fasting AND 2 HOURS AFTER largest MEAL) 02/21/21  Yes [provider]  Eszopiclone 3 MG TABS Take 3 mg by mouth at bedtime.   Yes [provider]  famotidine (PEPCID) 20 MG tablet  Take 20 mg by mouth daily as needed for heartburn or indigestion.   Yes [provider]  Flaxseed, Linseed, (FLAXSEED OIL) 1200 MG CAPS Take 1,200 mg by mouth daily.   Yes [provider]  folic acid (FOLVITE) 800 MCG tablet Take 800 mcg by mouth at bedtime.   Yes [provider]  glucose blood (EASYMAX TEST) test strip USE AS DIRECTED testing ONCE A DAY varying TIMES (fasting AND 2 HOURS AFTER largest MEAL) 01/24/20  Yes [provider]  hydrochlorothiazide (MICROZIDE) 12.5 MG capsule Take 12.5 mg by mouth daily.    Yes [provider]  HYDROcodone-acetaminophen (NORCO) 10-325 MG tablet Take 1 tablet by mouth every 6 (six) hours as needed for severe pain. 03/15/21  Yes [provider]  lamoTRIgine (LAMICTAL) 200 MG tablet 1 tablet orally once daily for 30 days   Yes [provider]  lisinopril (ZESTRIL) 40 MG tablet Take 40 mg by mouth daily. 03/20/23  Yes [provider]  memantine (NAMENDA) 10 MG tablet Take 10 mg by mouth daily.   Yes [provider]  methocarbamol (ROBAXIN) 500 MG tablet Take 500 mg by mouth 3 (three) times daily as needed for muscle spasms. 12/04/20  Yes [provider]  pantoprazole  (PROTONIX ) 40 MG tablet Take 1 tablet (40 mg total) by mouth daily before breakfast. 11/04/23  Yes Mahon, Charmaine CROME, NP  Probiotic Product (PROBIOTIC DAILY PO) Take 1 capsule by mouth daily.   Yes [provider]  propranolol (INDERAL) 10 MG tablet Take 20 mg by mouth 2 (two) times daily. 03/29/21  Yes [provider]  rosuvastatin (CRESTOR) 20 MG tablet Take 20 mg by mouth at bedtime. 03/29/21  Yes [provider]  tolterodine (DETROL LA) 4 MG 24 hr capsule Take 4 mg by mouth daily. 03/30/21  Yes [provider]  Ubrogepant (UBRELVY) 100 MG TABS Take 100 mg by mouth daily as needed.   Yes [provider]  Venlafaxine HCl 225 MG TB24 Take 225 mg by mouth daily.   Yes [provider]  VICTOZA 18 MG/3ML SOPN Inject 3 mg into the skin daily. 12/17/23  Yes [provider]    Allergies as of 02/10/2024 - Review Complete 02/10/2024  Allergen Reaction Noted   Lifitegrast Other (See Comments) 05/28/2022   Other Other (See Comments) 03/07/2020   Sulfa antibiotics  12/10/2015   Penicillins Rash 12/10/2015    Family History  Problem Relation Age of Onset   Colon polyps Sister        pre-cancerous, numerous, required partial colectomy    Social History   Socioeconomic History   Marital status:  Married    Spouse name: Not on file   Number of children: Not on file   Years of education: Not on file   Highest education level: Not on file  Occupational History   Not on file  Tobacco Use   Smoking status: Never   Smokeless tobacco: Never  Vaping Use   Vaping status: Never Used  Substance and Sexual Activity   Alcohol use: Yes    Comment: occasionally   Drug use: No   Sexual activity: Yes  Other Topics Concern   Not on file  Social History Narrative   Not on file   Social Drivers of Health   Tobacco Use: Low Risk (02/10/2024)   Patient History    Smoking Tobacco Use: Never    Smokeless Tobacco Use: Never    Passive Exposure: Not on file  Financial Resource Strain: Not on file  Food Insecurity: Not on file  Transportation Needs: Not on file  Physical Activity: Not on file  Stress: Not on file  Social Connections: Not on file  Intimate Partner Violence: Not on file  Depression (PHQ2-9): Not on file  Alcohol Screen: Not on file  Housing: Not on file  Utilities: Not on file  Health Literacy: Not on file    Review of Systems   See HPI, otherwise negative ROS  Physical Exam: BP (!) 147/82   Pulse 74   Temp 98.7 F (37.1 C) (Oral)   Ht 5' (1.524 m)   Wt 128 lb 9.6 oz (58.3 kg)   SpO2 97%   BMI 25.12 kg/m  General:   Alert,  Well-developed, well-nourished, pleasant and cooperative in NAD Neck:  Supple; no masses or thyromegaly. No significant cervical adenopathy. Lungs:  Clear throughout to auscultation.   No wheezes, crackles, or rhonchi. No acute distress. Heart:  Regular rate and rhythm; no murmurs, clicks, rubs,  or gallops. Abdomen: Non-distended, normal bowel sounds.  Soft and nontender without appreciable mass or hepatosplenomegaly.  Right lower quadrant bulge when she stands.  Query right inguinal hernia; perhaps a smaller one on the left   Impression/Plan:    64 year old lady with dysmotility GERD.  Managing well with daily PPI and H2 blocker as  needed.  Constipation managed well with MiraLAX.  Positive family history of polyps with negative colonoscopy 2023; due for surveillance 2028.  Right lower quadrant hernia for which she is pursuing surgical consultation.  Continue taking Protonix  40 mg before supper in the evening  Use famotidine in the morning as needed  Continue with parallax daily as needed to facilitate bowel function  Office visit with us  in 6 months.  Notice: This dictation was prepared with Dragon dictation along with smaller phrase technology. Any transcriptional errors that result from this process are unintentional and may not be corrected upon review.  "

## 2024-02-10 NOTE — Patient Instructions (Addendum)
 Nice to see you again  Agree that your hernia should be repaired  Continue taking Protonix  40 mg before supper in the evening  Use famotidine in the morning as needed  Continue with parallax daily as needed to facilitate bowel function  Office visit with us  in 6 months.

## 2024-02-16 ENCOUNTER — Ambulatory Visit: Payer: Self-pay | Admitting: Surgery

## 2024-02-26 NOTE — Pre-Procedure Instructions (Signed)
 Surgical Instructions   Your procedure is scheduled on March 03, 2024. Report to [facility] Main Entrance A at 530 A.M., then check in with the Admitting office. Any questions or running late day of surgery: call [phone]  Questions prior to your surgery date: call [phone], Monday-Friday, 8am-4pm. If you experience any cold or flu symptoms such as cough, fever, chills, shortness of breath, etc. between now and your scheduled surgery, please notify us  at the above number.     Remember:  Do not eat after midnight the night before your surgery   You may drink clear liquids until 4:30AM the morning of your surgery.   Clear liquids allowed are: Water, Non-Citrus Juices (without pulp), Carbonated Beverages, Clear Tea (no milk, honey, etc.), Black Coffee Only (NO MILK, CREAM OR POWDERED CREAMER of any kind), and Gatorade.    Take these medicines the morning of surgery with A SIP OF WATER: ARIPiprazole (ABILIFY)  Atogepant (QULIPTA)  cetirizine (ZYRTEC)  lamoTRIgine (LAMICTAL)  memantine (NAMENDA)  propranolol (INDERAL)  tolterodine (DETROL LA)  venlafaxine XR (EFFEXOR-XR)   May take these medicines IF NEEDED: acetaminophen (TYLENOL) OR HYDROcodone-acetaminophen (NORCO)  famotidine (PEPCID)  methocarbamol (ROBAXIN)   One week prior to surgery, STOP taking any Aspirin (unless otherwise instructed by your surgeon) Aleve, Naproxen, Ibuprofen, Motrin, Advil, Goody's, BC's, all herbal medications, fish oil, and non-prescription vitamins.                  WHAT DO I DO ABOUT MY DIABETES MEDICATION?   DO NOT TAKE Victoza for 7 days prior to surgery. DO NOT TAKE Victoza after 02/24/24  HOW TO MANAGE YOUR DIABETES BEFORE AND AFTER SURGERY  Why is it important to control my blood sugar before and after surgery? Improving blood sugar levels before and after surgery helps healing and can limit problems. A way of improving blood sugar control is eating a healthy diet by:  Eating  less sugar and carbohydrates  Increasing activity/exercise  Talking with your doctor about reaching your blood sugar goals High blood sugars (greater than 180 mg/dL) can raise your risk of infections and slow your recovery, so you will need to focus on controlling your diabetes during the weeks before surgery. Make sure that the doctor who takes care of your diabetes knows about your planned surgery including the date and location.  How do I manage my blood sugar before surgery? Check your blood sugar at least 4 times a day, starting 2 days before surgery, to make sure that the level is not too high or low.  Check your blood sugar the morning of your surgery when you wake up and every 2 hours until you get to the Short Stay unit.  If your blood sugar is less than 70 mg/dL, you will need to treat for low blood sugar: Do not take insulin. Treat a low blood sugar (less than 70 mg/dL) with  cup of clear juice (cranberry or apple), 4 glucose tablets, OR glucose gel. Recheck blood sugar in 15 minutes after treatment (to make sure it is greater than 70 mg/dL). If your blood sugar is not greater than 70 mg/dL on recheck, call [phone] for further instructions. Report your blood sugar to the short stay nurse when you get to Short Stay.  If you are admitted to the hospital after surgery: Your blood sugar will be checked by the staff and you will probably be given insulin after surgery (instead of oral diabetes medicines) to make sure you have good  blood sugar levels. The goal for blood sugar control after surgery is 80-180 mg/dL.    Do NOT Smoke (Tobacco/Vaping) for 24 hours prior to your procedure.  If you use a CPAP at night, you may bring your mask/headgear for your overnight stay.   You will be asked to remove any contacts, glasses, piercing's, hearing aid's, dentures/partials prior to surgery. Please bring cases for these items if needed.    Your surgeon will determine if you are to be  admitted or discharged the same day.  Patients discharged the day of surgery will not be allowed to drive home, and someone needs to stay with them for 24 hours.  SURGICAL WAITING ROOM VISITATION Patients may have no more than 2 support people in the waiting area - these visitors may rotate.   Pre-op nurse will coordinate an appropriate time for 2 ADULT support persons, who may not rotate, to accompany patient in pre-op.  Children under the age of 25 must have an adult with them who is not the patient and must remain in the main waiting area with an adult.  If the patient needs to stay at the hospital during part of their recovery, the visitor guidelines for inpatient rooms apply.  Please refer to the [facility] website for the visitor guidelines for any additional information.   If you received a COVID test during your pre-op visit  it is requested that you wear a mask when out in public, stay away from anyone that may not be feeling well and notify your surgeon if you develop symptoms. If you have been in contact with anyone that has tested positive in the last 10 days please notify you surgeon.      Pre-operative CHG Bathing Instructions   You can play a key role in reducing the risk of infection after surgery. Your skin needs to be as free of germs as possible. You can reduce the number of germs on your skin by washing with CHG (chlorhexidine gluconate) soap before surgery. CHG is an antiseptic soap that kills germs and continues to kill germs even after washing.   DO NOT use if you have an allergy to chlorhexidine/CHG or antibacterial soaps. If your skin becomes reddened or irritated, stop using the CHG and notify one of our RNs at [phone].              TAKE A SHOWER THE NIGHT BEFORE SURGERY   Please keep in mind the following:  DO NOT shave, including legs and underarms, 48 hours prior to surgery.   You may shave your face before/day of surgery.  Place clean sheets on your bed  the night before surgery Use a clean washcloth (not used since being washed) for shower. DO NOT sleep with pet's night before surgery.  CHG Shower Instructions:  Wash your face and private area with normal soap. If you choose to wash your hair, wash first with your normal shampoo.  After you use shampoo/soap, rinse your hair and body thoroughly to remove shampoo/soap residue.  Turn the water OFF and apply half the bottle of CHG soap to a CLEAN washcloth.  Apply CHG soap ONLY FROM YOUR NECK DOWN TO YOUR TOES (washing for 3-5 minutes)  DO NOT use CHG soap on face, private areas, open wounds, or sores.  Pay special attention to the area where your surgery is being performed.  If you are having back surgery, having someone wash your back for you may be helpful. Wait 2 minutes  after CHG soap is applied, then you may rinse off the CHG soap.  Pat dry with a clean towel  Put on clean pajamas    Additional instructions for the day of surgery: If you choose, you may shower the morning of surgery with an antibacterial soap.  DO NOT APPLY any lotions, deodorants, cologne, or perfumes.   Do not wear jewelry or makeup Do not wear nail polish, gel polish, artificial nails, or any other type of covering on natural nails (fingers and toes) Do not bring valuables to the hospital. [facility] is not responsible for valuables/personal belongings. Put on clean/comfortable clothes.  Please brush your teeth.  Ask your nurse before applying any prescription medications to the skin.

## 2024-02-27 ENCOUNTER — Encounter (HOSPITAL_COMMUNITY)
Admission: RE | Admit: 2024-02-27 | Discharge: 2024-02-27 | Disposition: A | Source: Ambulatory Visit | Attending: Surgery

## 2024-02-27 ENCOUNTER — Encounter (HOSPITAL_COMMUNITY): Payer: Self-pay | Admitting: Emergency Medicine

## 2024-02-27 ENCOUNTER — Other Ambulatory Visit: Payer: Self-pay

## 2024-02-27 VITALS — BP 158/82 | HR 84 | Temp 98.4°F | Resp 17 | Ht 60.0 in | Wt 127.4 lb

## 2024-02-27 DIAGNOSIS — I129 Hypertensive chronic kidney disease with stage 1 through stage 4 chronic kidney disease, or unspecified chronic kidney disease: Secondary | ICD-10-CM | POA: Insufficient documentation

## 2024-02-27 DIAGNOSIS — K4090 Unilateral inguinal hernia, without obstruction or gangrene, not specified as recurrent: Secondary | ICD-10-CM | POA: Diagnosis not present

## 2024-02-27 DIAGNOSIS — N189 Chronic kidney disease, unspecified: Secondary | ICD-10-CM | POA: Insufficient documentation

## 2024-02-27 DIAGNOSIS — Z01818 Encounter for other preprocedural examination: Secondary | ICD-10-CM | POA: Insufficient documentation

## 2024-02-27 DIAGNOSIS — G43909 Migraine, unspecified, not intractable, without status migrainosus: Secondary | ICD-10-CM | POA: Diagnosis not present

## 2024-02-27 DIAGNOSIS — M797 Fibromyalgia: Secondary | ICD-10-CM | POA: Insufficient documentation

## 2024-02-27 DIAGNOSIS — K219 Gastro-esophageal reflux disease without esophagitis: Secondary | ICD-10-CM | POA: Insufficient documentation

## 2024-02-27 DIAGNOSIS — E1122 Type 2 diabetes mellitus with diabetic chronic kidney disease: Secondary | ICD-10-CM | POA: Diagnosis not present

## 2024-02-27 DIAGNOSIS — G4733 Obstructive sleep apnea (adult) (pediatric): Secondary | ICD-10-CM | POA: Insufficient documentation

## 2024-02-27 HISTORY — DX: Fibromyalgia: M79.7

## 2024-02-27 HISTORY — DX: Unspecified osteoarthritis, unspecified site: M19.90

## 2024-02-27 LAB — SURGICAL PCR SCREEN
MRSA, PCR: NEGATIVE
Staphylococcus aureus: POSITIVE — AB

## 2024-02-27 LAB — GLUCOSE, CAPILLARY: Glucose-Capillary: 95 mg/dL (ref 70–99)

## 2024-02-27 LAB — CBC
HCT: 39.2 % (ref 36.0–46.0)
Hemoglobin: 13.1 g/dL (ref 12.0–15.0)
MCH: 30 pg (ref 26.0–34.0)
MCHC: 33.4 g/dL (ref 30.0–36.0)
MCV: 89.9 fL (ref 80.0–100.0)
Platelets: 244 K/uL (ref 150–400)
RBC: 4.36 MIL/uL (ref 3.87–5.11)
RDW: 13 % (ref 11.5–15.5)
WBC: 8.3 K/uL (ref 4.0–10.5)
nRBC: 0 % (ref 0.0–0.2)

## 2024-02-27 LAB — BASIC METABOLIC PANEL WITH GFR
Anion gap: 9 (ref 5–15)
BUN: 12 mg/dL (ref 8–23)
CO2: 31 mmol/L (ref 22–32)
Calcium: 10 mg/dL (ref 8.9–10.3)
Chloride: 100 mmol/L (ref 98–111)
Creatinine, Ser: 0.83 mg/dL (ref 0.44–1.00)
GFR, Estimated: >60 mL/min
Glucose, Bld: 98 mg/dL (ref 70–99)
Potassium: 3.9 mmol/L (ref 3.5–5.1)
Sodium: 141 mmol/L (ref 135–145)

## 2024-02-27 LAB — HEMOGLOBIN A1C
Hgb A1c MFr Bld: 5.5 % (ref 4.8–5.6)
Mean Plasma Glucose: 111.15 mg/dL

## 2024-02-27 NOTE — Progress Notes (Signed)
 PCP - Rumaldo Lamer, NP Cardiologist - denies  PPM/ICD - denies Device Orders - n/a Rep Notified - n/a  Chest x-ray - denies EKG - 02-27-24 Stress Test - denies ECHO - denies Cardiac Cath - denies  Sleep Study - >5 years CPAP - No, per pt doesn't wear CPAP, snoring resolved by weight loss.  Fasting Blood Sugar - 119 Checks Blood Sugar once a day.  Last dose of GLP1 agonist-  02-27-24 GLP1 instructions: Pt currently taking Victoza daily, informed last dose to be on 03-01-24  Blood Thinner Instructions:denies Aspirin Instructions:denies  ERAS Protcol -ERAS PRE-SURGERY Ensure or G2- n/a  COVID TEST- n/a   Anesthesia review: Cardiac history, review EKG, CKD, possible stroke incidental finding on MRI for migraines, pt didn't f/u with Neurologist.  Patient denies shortness of breath, fever, cough and chest pain at PAT appointment   All instructions explained to the patient, with a verbal understanding of the material. Patient agrees to go over the instructions while at home for a better understanding. Patient also instructed to self quarantine after being tested for COVID-19. The opportunity to ask questions was provided.

## 2024-03-01 NOTE — Progress Notes (Signed)
 Anesthesia Chart Review:  Case: [id number] Date/Time: 03/03/24 0715   Procedure: REPAIR, HERNIA, INGUINAL, LAPAROSCOPIC (Right) - LAPAROSCOPIC RIGHT INGUINAL HERNIA REPAIR WITH MESH   Anesthesia type: General   Pre-op diagnosis: RIGHT INGUINAL HERNIA   [facility]: [facility] ROOM 01 / [facility] OR   Surgeons: Stechschulte, Deward PARAS, MD       DISCUSSION: Patient is a 64 year old female scheduled for the above procedure.  History includes never smoker, postoperative N/V, HTN, DM2, CKD, GERD, OSA (does not use CPAP after weight loss), migraines, fibromyalgia, hysterectomy. She reported possible evidence of prior infarct on brain MRI from 2021 (imaging not available).   Last nephrology visit with Dr. Watt was on 02/19/2024 for follow-up CKD (Cr ~ 1.1-1.26 at baseline by notes) and for recurrent UTIs (on Macrobid daily as needed). By notes, renal US  previously showed a renal cyst without hydronephrosis. In April 2023, she had euglycemic ketoacidosis with beta hydroxybutyrate on 5.28, and Farxiga was stopped. He noted plans for right IHR on 03/03/2024 with preoperative labs. One year follow-up recommended.   At PAT, she was instructed to hold her daily Victoza for 24 hours before surgery.   A1c 5.5%. Creatinine normal at 0.83.    VS: BP (!) 158/82   Pulse 84   Temp 36.9 C   Resp 17   Ht 5' (1.524 m)   Wt 57.8 kg   SpO2 97%   BMI 24.88 kg/m    PROVIDERS: Theo Rumaldo SAILOR, FNP is PCP  Watt Manna, MD is nephrologist [facility], see CE) Malka Sensing, MD is neurologist, evaluated in 2024 by notes (for decrease in memory)  LABS: Labs reviewed: Acceptable for surgery. (all labs ordered are listed, but only abnormal results are displayed)  Labs Reviewed  SURGICAL PCR SCREEN - Abnormal; Notable for the following components:      Result Value   Staphylococcus aureus POSITIVE (*)    All other components within normal limits  GLUCOSE, CAPILLARY  HEMOGLOBIN A1C   BASIC METABOLIC PANEL WITH GFR  CBC     IMAGES: CT Varady 02/14/2016: IMPRESSION: Mild atrophy for age with mild patchy periventricular small vessel disease. No intracranial mass, hemorrhage, or extra-axial fluid collection. No acute appearing infarct. Mild mucosal thickening noted in several ethmoid air cells.   EKG: 02/27/2024: Normal sinus rhythm Minimal voltage criteria for LVH, may be normal variant ( R in aVL ) Borderline ECG Confirmed by Barbaraann Kotyk [phone]) on 02/27/2024 6:11:11 PM   CV: N/A  Past Medical History:  Diagnosis Date   Arthritis    CKD (chronic kidney disease)    Depression    Fibromyalgia    GERD (gastroesophageal reflux disease)    Hypertension    Migraine    PONV (postoperative nausea and vomiting)    Skin cancer of nose 2016   Sleep apnea    Stroke [facility]) 2021   Per pt MRI showed stroke   T2DM (type 2 diabetes mellitus) (HCC)     Past Surgical History:  Procedure Laterality Date   ABDOMINAL HYSTERECTOMY     BIOPSY  05/28/2021   Procedure: BIOPSY;  Surgeon: Shaaron Lamar HERO, MD;  Location: [facility] END[facility] SU[facility];  Service: Endoscopy;;   CESAREAN SECTION     COLONOSCOPY WITH PROPOFOL  N/A 05/28/2021   Procedure: COLONOSCOPY WITH PROPOFOL ;  Surgeon: Shaaron Lamar HERO, MD;  Location: [facility];  Service: Endoscopy;  Laterality: N/A;  7:30am   COLONOSCOPY WITH PROPOFOL  N/A 08/23/2021   Procedure: COLONOSCOPY WITH PROPOFOL ;  Surgeon: Shaaron Lamar HERO, MD;  Location: [facility];  Service: Endoscopy;  Laterality: N/A;  1:00pm   ESOPHAGEAL MANOMETRY N/A 04/16/2023   Procedure: MANOMETRY, ESOPHAGUS;  Surgeon: Shila Gustav GAILS, MD;  Location: [facility] ENDOSCOPY;  Service: Gastroenterology;  Laterality: N/A;   ESOPHAGOGASTRODUODENOSCOPY (EGD) WITH PROPOFOL  N/A 05/28/2021   Procedure: ESOPHAGOGASTRODUODENOSCOPY (EGD) WITH PROPOFOL ;  Surgeon: Shaaron Lamar HERO, MD;  [facility];  Service: Endoscopy;  Laterality: N/A;   MALONEY DILATION N/A 05/28/2021    Procedure: AGAPITO DILATION;  Surgeon: Shaaron Lamar HERO, MD;  Location: [facility];  Service: Endoscopy;  Laterality: N/A;   PLACEMENT OF BREAST IMPLANTS     SHOULDER SURGERY Right    TONSILLECTOMY      MEDICATIONS:  acetaminophen  (TYLENOL ) 650 MG CR tablet   ARIPiprazole (ABILIFY) 10 MG tablet   Atogepant (QULIPTA) 60 MG TABS   b complex vitamins capsule   Calcium Citrate-Vitamin D 315-5 MG-MCG TABS   cetirizine (ZYRTEC) 10 MG tablet   Embrace Lancets Ultra Thin 30G MISC   Eszopiclone 3 MG TABS   famotidine (PEPCID) 20 MG tablet   Flaxseed, Linseed, (FLAXSEED OIL) 1000 MG CAPS   folic acid (FOLVITE) 400 MCG tablet   glucose blood (EASYMAX TEST) test strip   hydrochlorothiazide (MICROZIDE) 12.5 MG capsule   HYDROcodone-acetaminophen  (NORCO) 10-325 MG tablet   lamoTRIgine (LAMICTAL) 200 MG tablet   lisinopril (ZESTRIL) 40 MG tablet   memantine (NAMENDA) 10 MG tablet   methocarbamol (ROBAXIN) 500 MG tablet   nitrofurantoin, macrocrystal-monohydrate, (MACROBID) 100 MG capsule   pantoprazole  (PROTONIX ) 40 MG tablet   Probiotic Product (PROBIOTIC DAILY PO)   propranolol (INDERAL) 10 MG tablet   rosuvastatin (CRESTOR) 20 MG tablet   tolterodine (DETROL LA) 4 MG 24 hr capsule   venlafaxine XR (EFFEXOR-XR) 150 MG 24 hr capsule   venlafaxine XR (EFFEXOR-XR) 75 MG 24 hr capsule   VICTOZA 18 MG/3ML SOPN   No current facility-administered medications for this encounter.   Isaiah Ruder, PA-C Surgical Short Stay/Anesthesiology [facility] Phone [phone] [facility] Phone [phone] 03/01/2024 3:07 PM

## 2024-03-01 NOTE — Anesthesia Preprocedure Evaluation (Signed)
"                                    Anesthesia Evaluation  Patient identified by MRN, date of birth, ID band Patient awake    Reviewed: Allergy & Precautions, H&P , NPO status , Patient's Chart, lab work & pertinent test results  History of Anesthesia Complications (+) PONV and history of anesthetic complications  Airway Mallampati: II   Neck ROM: full    Dental   Pulmonary sleep apnea    breath sounds clear to auscultation       Cardiovascular hypertension,  Rhythm:regular Rate:Normal     Neuro/Psych  Headaches PSYCHIATRIC DISORDERS  Depression     Neuromuscular disease CVA    GI/Hepatic ,GERD  ,,  Endo/Other  diabetes, Type 2    Renal/GU Renal InsufficiencyRenal disease     Musculoskeletal  (+) Arthritis ,  Fibromyalgia -  Abdominal   Peds  Hematology   Anesthesia Other Findings   Reproductive/Obstetrics                              Anesthesia Physical Anesthesia Plan  ASA: 3  Anesthesia Plan: General   Post-op Pain Management:    Induction: Intravenous  PONV Risk Score and Plan: 4 or greater and Ondansetron , Dexamethasone , Midazolam  and Treatment may vary due to age or medical condition  Airway Management Planned: Oral ETT  Additional Equipment:   Intra-op Plan:   Post-operative Plan: Extubation in OR  Informed Consent: I have reviewed the patients History and Physical, chart, labs and discussed the procedure including the risks, benefits and alternatives for the proposed anesthesia with the patient or authorized representative who has indicated his/her understanding and acceptance.     Dental advisory given  Plan Discussed with: CRNA, Anesthesiologist and Surgeon  Anesthesia Plan Comments: (PAT note written 03/01/2024 by Allison Zelenak, PA-C.  )         Anesthesia Quick Evaluation  "

## 2024-03-03 ENCOUNTER — Encounter (HOSPITAL_COMMUNITY): Payer: Self-pay | Admitting: Vascular Surgery

## 2024-03-03 ENCOUNTER — Encounter (HOSPITAL_COMMUNITY): Payer: Self-pay | Admitting: Surgery

## 2024-03-03 ENCOUNTER — Ambulatory Visit (HOSPITAL_COMMUNITY): Admitting: Anesthesiology

## 2024-03-03 ENCOUNTER — Encounter (HOSPITAL_COMMUNITY)
Admission: RE | Disposition: A | Discharge status: Home / Self Care | Payer: Self-pay | Source: Home / Self Care | Attending: Surgery

## 2024-03-03 ENCOUNTER — Ambulatory Visit (HOSPITAL_COMMUNITY)
Admission: RE | Admit: 2024-03-03 | Discharge: 2024-03-03 | Disposition: A | Discharge status: Home / Self Care | Attending: Surgery | Admitting: Surgery

## 2024-03-03 DIAGNOSIS — K4090 Unilateral inguinal hernia, without obstruction or gangrene, not specified as recurrent: Secondary | ICD-10-CM | POA: Insufficient documentation

## 2024-03-03 DIAGNOSIS — G4730 Sleep apnea, unspecified: Secondary | ICD-10-CM | POA: Insufficient documentation

## 2024-03-03 DIAGNOSIS — I129 Hypertensive chronic kidney disease with stage 1 through stage 4 chronic kidney disease, or unspecified chronic kidney disease: Secondary | ICD-10-CM | POA: Insufficient documentation

## 2024-03-03 DIAGNOSIS — Z01818 Encounter for other preprocedural examination: Secondary | ICD-10-CM

## 2024-03-03 DIAGNOSIS — M1990 Unspecified osteoarthritis, unspecified site: Secondary | ICD-10-CM | POA: Diagnosis not present

## 2024-03-03 DIAGNOSIS — E1122 Type 2 diabetes mellitus with diabetic chronic kidney disease: Secondary | ICD-10-CM | POA: Insufficient documentation

## 2024-03-03 DIAGNOSIS — N189 Chronic kidney disease, unspecified: Secondary | ICD-10-CM | POA: Diagnosis not present

## 2024-03-03 DIAGNOSIS — M797 Fibromyalgia: Secondary | ICD-10-CM | POA: Insufficient documentation

## 2024-03-03 LAB — GLUCOSE, CAPILLARY
Glucose-Capillary: 99 mg/dL (ref 70–99)
Glucose-Capillary: 129 mg/dL — ABNORMAL HIGH (ref 70–99)

## 2024-03-03 MED ORDER — FENTANYL CITRATE (PF) 100 MCG/2ML IJ SOLN: 25.0000 ug | INTRAMUSCULAR | Status: DC | PRN

## 2024-03-03 MED ORDER — KETOROLAC TROMETHAMINE 30 MG/ML IJ SOLN
INTRAMUSCULAR | Status: DC | PRN
  Administered 2024-03-03: 15 mg via INTRAVENOUS

## 2024-03-03 MED ORDER — OXYCODONE HCL 5 MG PO TABS
ORAL_TABLET | ORAL | Status: AC
  Filled 2024-03-03: qty 1

## 2024-03-03 MED ORDER — KETOROLAC TROMETHAMINE 30 MG/ML IJ SOLN
INTRAMUSCULAR | Status: AC
  Filled 2024-03-03: qty 1

## 2024-03-03 MED ORDER — ROCURONIUM BROMIDE 10 MG/ML (PF) SYRINGE
PREFILLED_SYRINGE | INTRAVENOUS | Status: DC | PRN
  Administered 2024-03-03: 50 mg via INTRAVENOUS

## 2024-03-03 MED ORDER — OXYCODONE-ACETAMINOPHEN 5-325 MG PO TABS: 1.0000 | ORAL_TABLET | ORAL | 0 refills | Status: AC | PRN

## 2024-03-03 MED ORDER — FENTANYL CITRATE (PF) 250 MCG/5ML IJ SOLN
INTRAMUSCULAR | Status: AC
  Filled 2024-03-03: qty 5

## 2024-03-03 MED ORDER — PHENYLEPHRINE 80 MCG/ML (10ML) SYRINGE FOR IV PUSH (FOR BLOOD PRESSURE SUPPORT)
PREFILLED_SYRINGE | INTRAVENOUS | Status: DC | PRN
  Administered 2024-03-03 (×3): 80 ug via INTRAVENOUS

## 2024-03-03 MED ORDER — MIDAZOLAM HCL 5 MG/5ML IJ SOLN
INTRAMUSCULAR | Status: DC | PRN
  Administered 2024-03-03: 2 mg via INTRAVENOUS

## 2024-03-03 MED ORDER — DEXAMETHASONE SOD PHOSPHATE PF 10 MG/ML IJ SOLN
INTRAMUSCULAR | Status: AC
  Filled 2024-03-03: qty 1

## 2024-03-03 MED ORDER — ONDANSETRON HCL 4 MG/2ML IJ SOLN: 4.0000 mg | Freq: Four times a day (QID) | INTRAMUSCULAR | Status: DC | PRN

## 2024-03-03 MED ORDER — CEFAZOLIN SODIUM-DEXTROSE 2-4 GM/100ML-% IV SOLN
2.0000 g | INTRAVENOUS | Status: AC
  Administered 2024-03-03: 2 g via INTRAVENOUS
  Filled 2024-03-03: qty 100

## 2024-03-03 MED ORDER — CHLORHEXIDINE GLUCONATE CLOTH 2 % EX PADS: 6.0000 | MEDICATED_PAD | Freq: Once | CUTANEOUS | Status: DC

## 2024-03-03 MED ORDER — BUPIVACAINE HCL (PF) 0.5 % IJ SOLN
INTRAMUSCULAR | Status: DC | PRN
  Administered 2024-03-03: 30 mL

## 2024-03-03 MED ORDER — DEXAMETHASONE SOD PHOSPHATE PF 10 MG/ML IJ SOLN
INTRAMUSCULAR | Status: DC | PRN
  Administered 2024-03-03: 5 mg via INTRAVENOUS

## 2024-03-03 MED ORDER — PHENYLEPHRINE 80 MCG/ML (10ML) SYRINGE FOR IV PUSH (FOR BLOOD PRESSURE SUPPORT)
PREFILLED_SYRINGE | INTRAVENOUS | Status: AC
  Filled 2024-03-03: qty 10

## 2024-03-03 MED ORDER — CHLORHEXIDINE GLUCONATE 0.12 % MT SOLN
15.0000 mL | Freq: Once | OROMUCOSAL | Status: AC
  Administered 2024-03-03: 15 mL via OROMUCOSAL
  Filled 2024-03-03: qty 15

## 2024-03-03 MED ORDER — ROCURONIUM BROMIDE 10 MG/ML (PF) SYRINGE
PREFILLED_SYRINGE | INTRAVENOUS | Status: AC
  Filled 2024-03-03: qty 10

## 2024-03-03 MED ORDER — PROPOFOL 10 MG/ML IV BOLUS
INTRAVENOUS | Status: AC
  Filled 2024-03-03: qty 20

## 2024-03-03 MED ORDER — SCOPOLAMINE 1 MG/3DAYS TD PT72
MEDICATED_PATCH | TRANSDERMAL | Status: AC
  Filled 2024-03-03: qty 1

## 2024-03-03 MED ORDER — ACETAMINOPHEN 500 MG PO TABS
1000.0000 mg | ORAL_TABLET | ORAL | Status: AC
  Administered 2024-03-03: 1000 mg via ORAL
  Filled 2024-03-03: qty 2

## 2024-03-03 MED ORDER — ONDANSETRON HCL 4 MG/2ML IJ SOLN
INTRAMUSCULAR | Status: AC
  Filled 2024-03-03: qty 2

## 2024-03-03 MED ORDER — ORAL CARE MOUTH RINSE: 15.0000 mL | Freq: Once | OROMUCOSAL | Status: AC

## 2024-03-03 MED ORDER — MIDAZOLAM HCL 2 MG/2ML IJ SOLN
INTRAMUSCULAR | Status: AC
  Filled 2024-03-03: qty 2

## 2024-03-03 MED ORDER — OXYCODONE HCL 5 MG/5ML PO SOLN: 5.0000 mg | Freq: Once | ORAL | Status: AC | PRN

## 2024-03-03 MED ORDER — SCOPOLAMINE 1 MG/3DAYS TD PT72
MEDICATED_PATCH | TRANSDERMAL | Status: DC | PRN
  Administered 2024-03-03: 1 via TRANSDERMAL

## 2024-03-03 MED ORDER — LIDOCAINE 2% (20 MG/ML) 5 ML SYRINGE
INTRAMUSCULAR | Status: DC | PRN
  Administered 2024-03-03: 50 mg via INTRAVENOUS

## 2024-03-03 MED ORDER — OXYCODONE HCL 5 MG PO TABS
5.0000 mg | ORAL_TABLET | Freq: Once | ORAL | Status: AC | PRN
  Administered 2024-03-03: 5 mg via ORAL

## 2024-03-03 MED ORDER — BUPIVACAINE HCL (PF) 0.5 % IJ SOLN
INTRAMUSCULAR | Status: AC
  Filled 2024-03-03: qty 30

## 2024-03-03 MED ORDER — PROPOFOL 10 MG/ML IV BOLUS
INTRAVENOUS | Status: DC | PRN
  Administered 2024-03-03: 100 mg via INTRAVENOUS

## 2024-03-03 MED ORDER — SUGAMMADEX SODIUM 200 MG/2ML IV SOLN
INTRAVENOUS | Status: DC | PRN
  Administered 2024-03-03 (×4): 50 mg via INTRAVENOUS

## 2024-03-03 MED ORDER — FENTANYL CITRATE (PF) 250 MCG/5ML IJ SOLN
INTRAMUSCULAR | Status: DC | PRN
  Administered 2024-03-03: 100 ug via INTRAVENOUS
  Administered 2024-03-03: 50 ug via INTRAVENOUS

## 2024-03-03 MED ORDER — ONDANSETRON HCL 4 MG/2ML IJ SOLN
INTRAMUSCULAR | Status: DC | PRN
  Administered 2024-03-03: 4 mg via INTRAVENOUS

## 2024-03-03 MED ORDER — 0.9 % SODIUM CHLORIDE (POUR BTL) OPTIME
TOPICAL | Status: DC | PRN
  Administered 2024-03-03: 1000 mL

## 2024-03-03 MED ORDER — LIDOCAINE 2% (20 MG/ML) 5 ML SYRINGE
INTRAMUSCULAR | Status: AC
  Filled 2024-03-03: qty 5

## 2024-03-03 MED ORDER — LACTATED RINGERS IV SOLN: INTRAVENOUS | Status: DC

## 2024-03-03 NOTE — Discharge Instructions (Signed)

## 2024-03-03 NOTE — Transfer of Care (Signed)
 Immediate Anesthesia Transfer of Care Note  Patient: Ashley Hickman  Procedure(s) Performed: REPAIR, HERNIA, INGUINAL, LAPAROSCOPIC (Right)  Patient Location: PACU  Anesthesia Type:General  Level of Consciousness: drowsy and patient cooperative  Airway & Oxygen Therapy: Patient Spontanous Breathing and Patient connected to nasal cannula oxygen  Post-op Assessment: Report given to RN and Post -op Vital signs reviewed and stable  Post vital signs: Reviewed and stable  Last Vitals:  Vitals Value Taken Time  BP 137/70 03/03/24 08:40  Temp 36.9 C 03/03/24 08:40  Pulse 73 03/03/24 08:41  Resp 22 03/03/24 08:41  SpO2 96 % 03/03/24 08:41  Vitals shown include unfiled device data.  Last Pain:  Vitals:   03/03/24 0618  TempSrc:   PainSc: 0-No pain         Complications: No notable events documented.

## 2024-03-03 NOTE — Op Note (Signed)
" ° °  Patient: Ashley Hickman (September 11, 1960, [id number])  Date of Surgery: 03/03/2024   Preoperative Diagnosis: 03/03/2024  Postoperative Diagnosis: Right inguinal hernia   Surgical Procedure: Laparoscopic right inguinal hernia repair   Operative Team Members:  Surgeons and Role:    * Andric Kerce, Deward PARAS, MD - Primary   Anesthesiologist: Maryclare Cornet, MD CRNA: Erlene Powell POUR, CRNA; Claudene Arlin LABOR, CRNA   Anesthesia: General   Fluids:  Total I/O In: 100 [IV Piggyback:100] Out: 10 [Blood:10]  Complications: None  Drains:  None  Specimen: None  Disposition:  PACU - hemodynamically stable.  Plan of Care: Discharge to home after PACU  Indications for Procedure: Ashley Hickman is a 64 y.o. female who presented with a right inguinal hernia  Findings:  Technique: Transabdominal preperitoneal (TAPP) Hernia Location: right direct and indirect inguinal hernia Mesh Size &Type:  Bard 3D max extra large right sided mesh Mesh Fixation: Endo-Universal hernia stapler  Infection status: Patient: Private Patient Elective Case Case: Elective Infection Present At Time Of Surgery (PATOS): None  Description of Procedure:  The patient was positioned supine, padded and secured to the bed, with both arms tucked.  The abdomen was widely prepped and draped.  A time out procedure was performed.  A 1 cm infraumbilical incision was made.  The abdomen was entered utilizing a Veress needle at the umbilical stalk, using a kocher clamp to elevate the umbilical stalk.  The abdomen was insufflated to 15 mm of Hg.  A blunt 12 mm trocar was inserted using the optical techinque.  Additional 5 mm trocars were placed in the left and right abdomen.  There was an direct and indirect hernia on the RIGHT.  Utilizing a transabdominal pre peritoneal technique (TAPP), a horizontal incision was made in the peritoneum, immediately below the umbilicus.  There was some bleeding in the area of the epigatric artery so it was  clipped with a laparoscopic clip applier with good hemostasis.  Dissection was carried out in the pre peritoneal space down to the level of the hernia sac which was reduced into the peritoneal cavity completely.  The round ligament was identified and divided utilizing cautery.  A large pre peritoneal dissection was performed to uncover the direct, indirect, femoral and obturator spaces.  Coopers ligament was uncovered medially and the psoas muscle uncovered laterally.  The mesh, as documented above, was opened and advanced into the pre peritoneal position so that it more than adequately covered the indirect, direct, femoral and obturator spaces.  The mesh laid flat, with no inferior folds and covered the entire myopectineal orifice.  The mesh was fixated with the endo-universal hernia stapler to Coopers ligament and the posterior aspect of the rectus muscle.  The peritoneal flap was closed with the same device.  There were no peritoneal defects or exposed mesh at the conclusion.  The umbilical trocar was removed and the fascial defect was closed with a figure of eight 0 Vicryl suture, utilizing a suture passer.  The peritoneal cavity was completely desufflated, the trocars removed and the skin closed with 4-0 Monocryl subcuticular suture and skin glue.  All sponge and needle counts were correct at the end of the case.  Deward Foy, MD General, Bariatric, & Minimally Invasive Surgery [facility] Surgery, [place]  "

## 2024-03-03 NOTE — Anesthesia Procedure Notes (Addendum)
 Procedure Name: Intubation Date/Time: 03/03/2024 7:35 AM  Performed by: Claudene Arlin LABOR, CRNAPre-anesthesia Checklist: Patient identified, Emergency Drugs available, Suction available and Patient being monitored Patient Re-evaluated:Patient Re-evaluated prior to induction Oxygen Delivery Method: Circle system utilized Preoxygenation: Pre-oxygenation with 100% oxygen Induction Type: IV induction Ventilation: Mask ventilation without difficulty Laryngoscope Size: Miller and 2 Grade View: Grade III Tube type: Oral Tube size: 7.0 mm Number of attempts: 1 Airway Equipment and Method: Stylet Placement Confirmation: ETT inserted through vocal cords under direct vision, positive ETCO2 and breath sounds checked- equal and bilateral Secured at: 21 cm Tube secured with: Tape Dental Injury: Teeth and Oropharynx as per pre-operative assessment  Comments: Limited mouth opening.  Grade 3 view.

## 2024-03-03 NOTE — H&P (Signed)
 "  Admitting Physician: Deward PARAS Kentavius Dettore  Service: General Surgery  CC: Right inguinal hernia  Subjective   HPI: Ashley Hickman is an 64 y.o. female who is here for right inguinal hernia repair.  Past Medical History:  Diagnosis Date   Arthritis    CKD (chronic kidney disease)    Depression    Fibromyalgia    GERD (gastroesophageal reflux disease)    Hypertension    Migraine    PONV (postoperative nausea and vomiting)    Skin cancer of nose 2016   Sleep apnea    Stroke [facility]) 2021   Per pt MRI showed stroke   T2DM (type 2 diabetes mellitus) (HCC)     Past Surgical History:  Procedure Laterality Date   ABDOMINAL HYSTERECTOMY     BIOPSY  05/28/2021   Procedure: BIOPSY;  Surgeon: Shaaron Lamar HERO, MD;  [facility]: [facility];  Service: Endoscopy;;   CESAREAN SECTION     COLONOSCOPY WITH PROPOFOL  N/A 05/28/2021   Procedure: COLONOSCOPY WITH PROPOFOL ;  Surgeon: Shaaron Lamar HERO, MD;  Location: [facility];  Service: Endoscopy;  Laterality: N/A;  7:30am   COLONOSCOPY WITH PROPOFOL  N/A 08/23/2021   Procedure: COLONOSCOPY WITH PROPOFOL ;  Surgeon: Shaaron Lamar HERO, MD;  [facility]: [facility];  Service: Endoscopy;  Laterality: N/A;  1:00pm   ESOPHAGEAL MANOMETRY N/A 04/16/2023   Procedure: MANOMETRY, ESOPHAGUS;  Surgeon: Shila Gustav GAILS, MD;  Location: [facility];  Service: Gastroenterology;  Laterality: N/A;   ESOPHAGOGASTRODUODENOSCOPY (EGD) WITH PROPOFOL  N/A 05/28/2021   Procedure: ESOPHAGOGASTRODUODENOSCOPY (EGD) WITH PROPOFOL ;  Surgeon: Shaaron Lamar HERO, MD;  [facility];  Service: Endoscopy;  Laterality: N/A;   MALONEY DILATION N/A 05/28/2021   Procedure: AGAPITO DILATION;  Surgeon: Shaaron Lamar HERO, MD;  Location: [facility];  Service: Endoscopy;  Laterality: N/A;   PLACEMENT OF BREAST IMPLANTS     SHOULDER SURGERY Right    TONSILLECTOMY      Family History  Problem Relation Age of Onset   Colon polyps Sister        pre-cancerous, numerous, required  partial colectomy    Social:  reports that she has never smoked. She has never used smokeless tobacco. She reports current alcohol use. She reports that she does not use drugs.  Allergies: Allergies[1]  Medications: Current Outpatient Medications  Medication Instructions   acetaminophen  (TYLENOL ) 1,300 mg, Oral, Every 8 hours PRN   ARIPiprazole (ABILIFY) 10 mg, Oral, Daily   b complex vitamins capsule 1 capsule, Oral, Daily   Calcium Citrate-Vitamin D 315-5 MG-MCG TABS 1 tablet, Oral, Daily   cetirizine (ZYRTEC) 10 mg, Daily   Embrace Lancets Ultra Thin 30G MISC USE AS DIRECTED ONCE DAILY testing AT varying TIMES (fasting AND 2 HOURS AFTER largest MEAL)   Eszopiclone 3 mg, Oral, Daily at bedtime   famotidine (PEPCID) 20 mg, Oral, Daily PRN   Flaxseed Oil 1,000 mg, Oral, Daily   folic acid (FOLVITE) 400 mcg, Oral, Daily at bedtime   glucose blood (EASYMAX TEST) test strip USE AS DIRECTED testing ONCE A DAY varying TIMES (fasting AND 2 HOURS AFTER largest MEAL)   hydrochlorothiazide (MICROZIDE) 12.5 mg, Oral, Daily   HYDROcodone-acetaminophen  (NORCO) 10-325 MG tablet 1 tablet, Oral, Daily PRN   lamoTRIgine (LAMICTAL) 200 MG tablet 1 tablet orally once daily for 30 days   lisinopril (ZESTRIL) 40 mg, Oral, Daily   memantine (NAMENDA) 10 mg, Oral, Daily   methocarbamol (ROBAXIN) 500 mg, Oral, Daily PRN   nitrofurantoin (macrocrystal-monohydrate) (  MACROBID) 100 mg, Oral, Daily PRN   pantoprazole  (PROTONIX ) 40 mg, Oral, Daily before breakfast   Probiotic Product (PROBIOTIC DAILY PO) 1 capsule, Oral, Daily   propranolol (INDERAL) 20 mg, Oral, 2 times daily   Qulipta 60 mg, Oral, Daily   rosuvastatin (CRESTOR) 20 mg, Oral, Daily at bedtime   tolterodine (DETROL LA) 4 mg, Oral, Daily   venlafaxine XR (EFFEXOR-XR) 75 mg, Oral, Daily, Takes with 150mg  to equal 225mg    venlafaxine XR (EFFEXOR-XR) 150 mg, Oral, Daily, Takes with 75mg  to equal 225mg    Victoza 0.6 mg, Subcutaneous, Daily     ROS - all of the below systems have been reviewed with the patient and positives are indicated with bold text General: chills, fever or night sweats Eyes: blurry vision or double vision ENT: epistaxis or sore throat Allergy/Immunology: itchy/watery eyes or nasal congestion Hematologic/Lymphatic: bleeding problems, blood clots or swollen lymph nodes Endocrine: temperature intolerance or unexpected weight changes Breast: new or changing breast lumps or nipple discharge Resp: cough, shortness of breath, or wheezing CV: chest pain or dyspnea on exertion GI: as per HPI GU: dysuria, trouble voiding, or hematuria MSK: joint pain or joint stiffness Neuro: TIA or stroke symptoms Derm: pruritus and skin lesion changes Psych: anxiety and depression  Objective   PE Blood pressure (!) (P) 172/87, pulse (P) 72, temperature (P) 98.5 F (36.9 C), temperature source (P) Oral, resp. rate (P) 16, height (P) 5' (1.524 m), weight (P) 57.8 kg, SpO2 (P) 98%. Constitutional: NAD; conversant; no deformities Eyes: Moist conjunctiva; no lid lag; anicteric; PERRL Neck: Trachea midline; no thyromegaly Lungs: Normal respiratory effort; no tactile fremitus CV: RRR; no palpable thrills; no pitting edema GI: Abd right inguinal hernia; no palpable hepatosplenomegaly MSK: Normal range of motion of extremities; no clubbing/cyanosis Psychiatric: Appropriate affect; alert and oriented x3 Lymphatic: No palpable cervical or axillary lymphadenopathy  Results for orders placed or performed during the hospital encounter of 03/03/24 (from the past 24 hours)  Glucose, capillary     Status: None   Collection Time: 03/03/24  5:47 AM  Result Value Ref Range   Glucose-Capillary 99 70 - 99 mg/dL    Imaging Orders  No imaging studies ordered today     Assessment and Plan   Ashley Hickman is an 64 y.o. female with a right inguinal hernia.  I recommended laparoscopic repair.  We discussed the procedure, its risks,  benefits and alternatives and the patient granted consent to proceed.    Deward JINNY Foy, MD  [facility] Use [email or web address] to contact on call provider       [1]  Allergies Allergen Reactions   Lifitegrast Other (See Comments)    Other Reaction(s): eye redness, eye swelling   Other Other (See Comments)   Sulfa Antibiotics     Chills, felt horrible, achy   Penicillins Rash   "

## 2024-03-04 ENCOUNTER — Encounter (HOSPITAL_COMMUNITY): Payer: Self-pay | Admitting: Surgery

## 2024-03-04 NOTE — Anesthesia Postprocedure Evaluation (Signed)
"   Anesthesia Post Note  Patient: Ashley Hickman  Procedure(s) Performed: REPAIR, HERNIA, INGUINAL, LAPAROSCOPIC (Right)     Patient location during evaluation: PACU Anesthesia Type: General Level of consciousness: awake and alert Pain management: pain level controlled Vital Signs Assessment: post-procedure vital signs reviewed and stable Respiratory status: spontaneous breathing, nonlabored ventilation, respiratory function stable and patient connected to nasal cannula oxygen Cardiovascular status: blood pressure returned to baseline and stable Postop Assessment: no apparent nausea or vomiting Anesthetic complications: no   No notable events documented.  Last Vitals:  Vitals:   03/03/24 0900 03/03/24 0910  BP: (!) 141/69 (!) 143/67  Pulse: 73 76  Resp: 17 16  Temp:  36.9 C  SpO2: 95% 94%    Last Pain:  Vitals:   03/03/24 0905  TempSrc:   PainSc: 4                  Hurley Blevins S      "
